# Patient Record
Sex: Male | Born: 1975 | Race: Black or African American | Hispanic: No | Marital: Married | Smoking: Never smoker
Health system: Southern US, Community
[De-identification: ages and names within clinical notes are randomized; demographics above are authoritative.]

## PROBLEM LIST (undated history)

## (undated) DIAGNOSIS — I2699 Other pulmonary embolism without acute cor pulmonale: Secondary | ICD-10-CM

## (undated) DIAGNOSIS — L309 Dermatitis, unspecified: Secondary | ICD-10-CM

## (undated) HISTORY — PX: LAPAROSCOPIC GASTRIC BAND REMOVAL WITH LAPAROSCOPIC GASTRIC SLEEVE RESECTION: SHX6498

## (undated) HISTORY — PX: WRIST FRACTURE SURGERY: SHX121

## (undated) HISTORY — PX: KNEE SURGERY: SHX244

---

## 1997-12-12 ENCOUNTER — Emergency Department (HOSPITAL_COMMUNITY): Admission: EM | Admit: 1997-12-12 | Discharge: 1997-12-13 | Payer: Self-pay | Admitting: Emergency Medicine

## 1998-01-19 ENCOUNTER — Emergency Department (HOSPITAL_COMMUNITY): Admission: EM | Admit: 1998-01-19 | Discharge: 1998-01-19 | Payer: Self-pay | Admitting: Emergency Medicine

## 1998-07-05 ENCOUNTER — Emergency Department (HOSPITAL_COMMUNITY): Admission: EM | Admit: 1998-07-05 | Discharge: 1998-07-06 | Payer: Self-pay | Admitting: Emergency Medicine

## 1998-09-29 ENCOUNTER — Emergency Department (HOSPITAL_COMMUNITY): Admission: EM | Admit: 1998-09-29 | Discharge: 1998-09-29 | Payer: Self-pay | Admitting: Emergency Medicine

## 1999-06-21 ENCOUNTER — Emergency Department (HOSPITAL_COMMUNITY): Admission: EM | Admit: 1999-06-21 | Discharge: 1999-06-21 | Payer: Self-pay | Admitting: Emergency Medicine

## 2000-04-10 ENCOUNTER — Emergency Department (HOSPITAL_COMMUNITY): Admission: EM | Admit: 2000-04-10 | Discharge: 2000-04-10 | Payer: Self-pay | Admitting: Emergency Medicine

## 2000-09-23 ENCOUNTER — Emergency Department (HOSPITAL_COMMUNITY): Admission: EM | Admit: 2000-09-23 | Discharge: 2000-09-23 | Payer: Self-pay | Admitting: Emergency Medicine

## 2001-06-18 ENCOUNTER — Emergency Department (HOSPITAL_COMMUNITY): Admission: EM | Admit: 2001-06-18 | Discharge: 2001-06-18 | Payer: Self-pay | Admitting: Emergency Medicine

## 2001-09-27 ENCOUNTER — Emergency Department (HOSPITAL_COMMUNITY): Admission: EM | Admit: 2001-09-27 | Discharge: 2001-09-27 | Payer: Self-pay

## 2002-02-28 ENCOUNTER — Encounter: Payer: Self-pay | Admitting: Emergency Medicine

## 2002-02-28 ENCOUNTER — Emergency Department (HOSPITAL_COMMUNITY): Admission: EM | Admit: 2002-02-28 | Discharge: 2002-02-28 | Payer: Self-pay | Admitting: Emergency Medicine

## 2002-11-07 ENCOUNTER — Emergency Department (HOSPITAL_COMMUNITY): Admission: EM | Admit: 2002-11-07 | Discharge: 2002-11-07 | Payer: Self-pay | Admitting: Emergency Medicine

## 2002-12-02 ENCOUNTER — Encounter: Payer: Self-pay | Admitting: Emergency Medicine

## 2002-12-02 ENCOUNTER — Emergency Department (HOSPITAL_COMMUNITY): Admission: EM | Admit: 2002-12-02 | Discharge: 2002-12-02 | Payer: Self-pay | Admitting: Emergency Medicine

## 2002-12-25 ENCOUNTER — Emergency Department (HOSPITAL_COMMUNITY): Admission: EM | Admit: 2002-12-25 | Discharge: 2002-12-25 | Payer: Self-pay | Admitting: *Deleted

## 2003-06-09 ENCOUNTER — Emergency Department (HOSPITAL_COMMUNITY): Admission: EM | Admit: 2003-06-09 | Discharge: 2003-06-09 | Payer: Self-pay | Admitting: Emergency Medicine

## 2003-10-16 ENCOUNTER — Emergency Department (HOSPITAL_COMMUNITY): Admission: EM | Admit: 2003-10-16 | Discharge: 2003-10-16 | Payer: Self-pay | Admitting: Emergency Medicine

## 2005-10-25 ENCOUNTER — Emergency Department (HOSPITAL_COMMUNITY): Admission: EM | Admit: 2005-10-25 | Discharge: 2005-10-26 | Payer: Self-pay | Admitting: *Deleted

## 2005-12-29 ENCOUNTER — Emergency Department (HOSPITAL_COMMUNITY): Admission: EM | Admit: 2005-12-29 | Discharge: 2005-12-29 | Payer: Self-pay | Admitting: Emergency Medicine

## 2008-04-06 ENCOUNTER — Emergency Department (HOSPITAL_COMMUNITY): Admission: EM | Admit: 2008-04-06 | Discharge: 2008-04-06 | Payer: Self-pay | Admitting: Emergency Medicine

## 2008-06-24 ENCOUNTER — Emergency Department (HOSPITAL_COMMUNITY): Admission: EM | Admit: 2008-06-24 | Discharge: 2008-06-24 | Payer: Self-pay | Admitting: Emergency Medicine

## 2008-06-25 ENCOUNTER — Encounter (INDEPENDENT_AMBULATORY_CARE_PROVIDER_SITE_OTHER): Payer: Self-pay | Admitting: Emergency Medicine

## 2008-06-25 ENCOUNTER — Inpatient Hospital Stay (HOSPITAL_COMMUNITY): Admission: EM | Admit: 2008-06-25 | Discharge: 2008-06-28 | Payer: Self-pay | Admitting: Emergency Medicine

## 2008-06-25 ENCOUNTER — Ambulatory Visit: Payer: Self-pay | Admitting: Vascular Surgery

## 2008-06-27 ENCOUNTER — Other Ambulatory Visit: Payer: Self-pay | Admitting: Cardiovascular Disease

## 2008-08-19 ENCOUNTER — Emergency Department (HOSPITAL_COMMUNITY): Admission: EM | Admit: 2008-08-19 | Discharge: 2008-08-19 | Payer: Self-pay | Admitting: Emergency Medicine

## 2008-08-31 ENCOUNTER — Emergency Department (HOSPITAL_COMMUNITY): Admission: EM | Admit: 2008-08-31 | Discharge: 2008-09-01 | Payer: Self-pay | Admitting: Emergency Medicine

## 2009-04-07 ENCOUNTER — Emergency Department (HOSPITAL_COMMUNITY): Admission: EM | Admit: 2009-04-07 | Discharge: 2009-04-08 | Payer: Self-pay | Admitting: Emergency Medicine

## 2010-04-02 ENCOUNTER — Emergency Department (HOSPITAL_COMMUNITY)
Admission: EM | Admit: 2010-04-02 | Discharge: 2010-04-02 | Payer: Self-pay | Source: Home / Self Care | Admitting: Emergency Medicine

## 2010-08-10 LAB — RAPID STREP SCREEN (MED CTR MEBANE ONLY): Streptococcus, Group A Screen (Direct): NEGATIVE

## 2010-08-17 ENCOUNTER — Emergency Department (HOSPITAL_COMMUNITY)
Admission: EM | Admit: 2010-08-17 | Discharge: 2010-08-17 | Disposition: A | Discharge status: Home / Self Care | Payer: Self-pay | Attending: Emergency Medicine | Admitting: Emergency Medicine

## 2010-08-17 DIAGNOSIS — M25539 Pain in unspecified wrist: Secondary | ICD-10-CM | POA: Insufficient documentation

## 2010-09-01 LAB — CBC
HCT: 40 % (ref 39.0–52.0)
Hemoglobin: 13.6 g/dL (ref 13.0–17.0)
MCHC: 34 g/dL (ref 30.0–36.0)
MCV: 88.7 fL (ref 78.0–100.0)
Platelets: 229 10*3/uL (ref 150–400)
RBC: 4.52 MIL/uL (ref 4.22–5.81)
RDW: 14.1 % (ref 11.5–15.5)
WBC: 9.9 10*3/uL (ref 4.0–10.5)

## 2010-09-01 LAB — POCT CARDIAC MARKERS
CKMB, poc: <1 ng/mL — ABNORMAL LOW (ref 1.0–8.0)
Myoglobin, poc: 82.5 ng/mL (ref 12–200)
Troponin i, poc: <0.05 ng/mL (ref 0.00–0.09)

## 2010-09-01 LAB — COMPREHENSIVE METABOLIC PANEL
ALT: 24 U/L (ref 0–53)
AST: 19 U/L (ref 0–37)
Albumin: 3.2 g/dL — ABNORMAL LOW (ref 3.5–5.2)
Alkaline Phosphatase: 68 U/L (ref 39–117)
BUN: 9 mg/dL (ref 6–23)
CO2: 25 mEq/L (ref 19–32)
Calcium: 8.8 mg/dL (ref 8.4–10.5)
Chloride: 107 mEq/L (ref 96–112)
Creatinine, Ser: 0.88 mg/dL (ref 0.4–1.5)
GFR calc Af Amer: >60 mL/min (ref >60)
GFR calc non Af Amer: >60 mL/min (ref >60)
Glucose, Bld: 123 mg/dL — ABNORMAL HIGH (ref 70–99)
Potassium: 3.7 mEq/L (ref 3.5–5.1)
Sodium: 138 mEq/L (ref 135–145)
Total Bilirubin: 0.8 mg/dL (ref 0.3–1.2)
Total Protein: 7 g/dL (ref 6.0–8.3)

## 2010-09-01 LAB — LIPASE, BLOOD: Lipase: 21 U/L (ref 11–59)

## 2010-09-01 LAB — DIFFERENTIAL
Basophils Absolute: 0 10*3/uL (ref 0.0–0.1)
Basophils Relative: 0 % (ref 0–1)
Eosinophils Absolute: 0.3 10*3/uL (ref 0.0–0.7)
Eosinophils Relative: 3 % (ref 0–5)
Lymphocytes Relative: 31 % (ref 12–46)
Lymphs Abs: 3 10*3/uL (ref 0.7–4.0)
Monocytes Absolute: 0.7 10*3/uL (ref 0.1–1.0)
Monocytes Relative: 7 % (ref 3–12)
Neutro Abs: 5.8 10*3/uL (ref 1.7–7.7)
Neutrophils Relative %: 59 % (ref 43–77)

## 2010-09-08 LAB — URINALYSIS, ROUTINE W REFLEX MICROSCOPIC
Bilirubin Urine: NEGATIVE
Glucose, UA: NEGATIVE mg/dL
Hgb urine dipstick: NEGATIVE
Ketones, ur: NEGATIVE mg/dL
Nitrite: NEGATIVE
Protein, ur: NEGATIVE mg/dL
Specific Gravity, Urine: 1.028 (ref 1.005–1.030)
Urobilinogen, UA: 1 mg/dL (ref 0.0–1.0)
pH: 6 (ref 5.0–8.0)

## 2010-09-08 LAB — CK TOTAL AND CKMB (NOT AT ARMC)
CK, MB: 1.5 ng/mL (ref 0.3–4.0)
Relative Index: 0.6 (ref 0.0–2.5)
Total CK: 241 U/L — ABNORMAL HIGH (ref 7–232)

## 2010-09-08 LAB — COMPREHENSIVE METABOLIC PANEL
ALT: 31 U/L (ref 0–53)
AST: 22 U/L (ref 0–37)
Albumin: 3.2 g/dL — ABNORMAL LOW (ref 3.5–5.2)
Alkaline Phosphatase: 79 U/L (ref 39–117)
BUN: 11 mg/dL (ref 6–23)
CO2: 24 mEq/L (ref 19–32)
Calcium: 8.8 mg/dL (ref 8.4–10.5)
Chloride: 107 mEq/L (ref 96–112)
Creatinine, Ser: 0.87 mg/dL (ref 0.4–1.5)
GFR calc Af Amer: >60 mL/min (ref >60)
GFR calc non Af Amer: >60 mL/min (ref >60)
Glucose, Bld: 122 mg/dL — ABNORMAL HIGH (ref 70–99)
Potassium: 3.8 mEq/L (ref 3.5–5.1)
Sodium: 137 mEq/L (ref 135–145)
Total Bilirubin: 0.7 mg/dL (ref 0.3–1.2)
Total Protein: 6.7 g/dL (ref 6.0–8.3)

## 2010-09-08 LAB — URINE MICROSCOPIC-ADD ON

## 2010-09-08 LAB — DIFFERENTIAL
Basophils Absolute: 0 10*3/uL (ref 0.0–0.1)
Basophils Relative: 0 % (ref 0–1)
Eosinophils Absolute: 0.2 10*3/uL (ref 0.0–0.7)
Eosinophils Relative: 2 % (ref 0–5)
Lymphocytes Relative: 27 % (ref 12–46)
Lymphs Abs: 2.8 10*3/uL (ref 0.7–4.0)
Monocytes Absolute: 0.6 10*3/uL (ref 0.1–1.0)
Monocytes Relative: 6 % (ref 3–12)
Neutro Abs: 6.6 10*3/uL (ref 1.7–7.7)
Neutrophils Relative %: 64 % (ref 43–77)

## 2010-09-08 LAB — POCT CARDIAC MARKERS
CKMB, poc: <1 ng/mL — ABNORMAL LOW (ref 1.0–8.0)
CKMB, poc: <1 ng/mL — ABNORMAL LOW (ref 1.0–8.0)
Myoglobin, poc: 60.1 ng/mL (ref 12–200)
Myoglobin, poc: 67.8 ng/mL (ref 12–200)
Troponin i, poc: <0.05 ng/mL (ref 0.00–0.09)
Troponin i, poc: <0.05 ng/mL (ref 0.00–0.09)

## 2010-09-08 LAB — CBC
HCT: 40.7 % (ref 39.0–52.0)
Hemoglobin: 13.6 g/dL (ref 13.0–17.0)
MCHC: 33.3 g/dL (ref 30.0–36.0)
MCV: 88.3 fL (ref 78.0–100.0)
Platelets: 259 10*3/uL (ref 150–400)
RBC: 4.61 MIL/uL (ref 4.22–5.81)
RDW: 14.2 % (ref 11.5–15.5)
WBC: 10.2 10*3/uL (ref 4.0–10.5)

## 2010-09-08 LAB — RAPID URINE DRUG SCREEN, HOSP PERFORMED
Amphetamines: NOT DETECTED
Barbiturates: NOT DETECTED
Benzodiazepines: NOT DETECTED
Cocaine: NOT DETECTED
Opiates: NOT DETECTED
Tetrahydrocannabinol: NOT DETECTED

## 2010-09-08 LAB — D-DIMER, QUANTITATIVE: D-Dimer, Quant: 0.29 ug/mL-FEU (ref 0.00–0.48)

## 2010-09-08 LAB — LIPASE, BLOOD: Lipase: 21 U/L (ref 11–59)

## 2010-09-08 LAB — POCT I-STAT, CHEM 8
BUN: 12 mg/dL (ref 6–23)
Calcium, Ion: 1.15 mmol/L (ref 1.12–1.32)
Chloride: 106 mEq/L (ref 96–112)
Creatinine, Ser: 0.9 mg/dL (ref 0.4–1.5)
Glucose, Bld: 118 mg/dL — ABNORMAL HIGH (ref 70–99)
HCT: 43 % (ref 39.0–52.0)
Hemoglobin: 14.6 g/dL (ref 13.0–17.0)
Potassium: 3.9 mEq/L (ref 3.5–5.1)
Sodium: 140 mEq/L (ref 135–145)
TCO2: 24 mmol/L (ref 0–100)

## 2010-09-08 LAB — PROTIME-INR
INR: 1 (ref 0.00–1.49)
Prothrombin Time: 13.1 seconds (ref 11.6–15.2)

## 2010-09-08 LAB — ETHANOL: Alcohol, Ethyl (B): <5 mg/dL (ref 0–10)

## 2010-09-08 LAB — APTT: aPTT: 27 seconds (ref 24–37)

## 2010-09-08 LAB — TROPONIN I: Troponin I: 0.02 ng/mL (ref 0.00–0.06)

## 2010-09-09 LAB — URINALYSIS, ROUTINE W REFLEX MICROSCOPIC
Bilirubin Urine: NEGATIVE
Glucose, UA: NEGATIVE mg/dL
Hgb urine dipstick: NEGATIVE
Ketones, ur: NEGATIVE mg/dL
Nitrite: NEGATIVE
Protein, ur: NEGATIVE mg/dL
Specific Gravity, Urine: 1.025 (ref 1.005–1.030)
Urobilinogen, UA: 1 mg/dL (ref 0.0–1.0)
pH: 7.5 (ref 5.0–8.0)

## 2010-09-09 LAB — URINE CULTURE
Colony Count: NO GROWTH
Culture: NO GROWTH

## 2010-09-09 LAB — GC/CHLAMYDIA PROBE AMP, GENITAL
Chlamydia, DNA Probe: POSITIVE — AB
GC Probe Amp, Genital: NEGATIVE

## 2010-09-09 LAB — URINE MICROSCOPIC-ADD ON

## 2010-09-13 LAB — GLUCOSE, CAPILLARY
Glucose-Capillary: 101 mg/dL — ABNORMAL HIGH (ref 70–99)
Glucose-Capillary: 103 mg/dL — ABNORMAL HIGH (ref 70–99)
Glucose-Capillary: 104 mg/dL — ABNORMAL HIGH (ref 70–99)
Glucose-Capillary: 104 mg/dL — ABNORMAL HIGH (ref 70–99)
Glucose-Capillary: 106 mg/dL — ABNORMAL HIGH (ref 70–99)
Glucose-Capillary: 107 mg/dL — ABNORMAL HIGH (ref 70–99)
Glucose-Capillary: 94 mg/dL (ref 70–99)
Glucose-Capillary: 96 mg/dL (ref 70–99)
Glucose-Capillary: 99 mg/dL (ref 70–99)

## 2010-09-13 LAB — LIPID PANEL
Cholesterol: 128 mg/dL (ref 0–200)
HDL: 22 mg/dL — ABNORMAL LOW (ref >39)
LDL Cholesterol: 79 mg/dL (ref 0–99)
Total CHOL/HDL Ratio: 5.8 RATIO
Triglycerides: 135 mg/dL (ref <150)
VLDL: 27 mg/dL (ref 0–40)

## 2010-09-13 LAB — BASIC METABOLIC PANEL
BUN: 10 mg/dL (ref 6–23)
BUN: 8 mg/dL (ref 6–23)
BUN: 8 mg/dL (ref 6–23)
CO2: 24 mEq/L (ref 19–32)
CO2: 25 mEq/L (ref 19–32)
CO2: 26 mEq/L (ref 19–32)
Calcium: 8.4 mg/dL (ref 8.4–10.5)
Calcium: 8.5 mg/dL (ref 8.4–10.5)
Calcium: 9.2 mg/dL (ref 8.4–10.5)
Chloride: 105 mEq/L (ref 96–112)
Chloride: 105 mEq/L (ref 96–112)
Chloride: 106 mEq/L (ref 96–112)
Creatinine, Ser: 0.84 mg/dL (ref 0.4–1.5)
Creatinine, Ser: 0.94 mg/dL (ref 0.4–1.5)
Creatinine, Ser: 0.99 mg/dL (ref 0.4–1.5)
GFR calc Af Amer: >60 mL/min (ref >60)
GFR calc Af Amer: >60 mL/min (ref >60)
GFR calc Af Amer: >60 mL/min (ref >60)
GFR calc non Af Amer: >60 mL/min (ref >60)
GFR calc non Af Amer: >60 mL/min (ref >60)
GFR calc non Af Amer: >60 mL/min (ref >60)
Glucose, Bld: 101 mg/dL — ABNORMAL HIGH (ref 70–99)
Glucose, Bld: 108 mg/dL — ABNORMAL HIGH (ref 70–99)
Glucose, Bld: 109 mg/dL — ABNORMAL HIGH (ref 70–99)
Potassium: 3.7 mEq/L (ref 3.5–5.1)
Potassium: 4.1 mEq/L (ref 3.5–5.1)
Potassium: 4.1 mEq/L (ref 3.5–5.1)
Sodium: 134 mEq/L — ABNORMAL LOW (ref 135–145)
Sodium: 137 mEq/L (ref 135–145)
Sodium: 138 mEq/L (ref 135–145)

## 2010-09-13 LAB — URINALYSIS, ROUTINE W REFLEX MICROSCOPIC
Bilirubin Urine: NEGATIVE
Glucose, UA: NEGATIVE mg/dL
Ketones, ur: NEGATIVE mg/dL
Nitrite: NEGATIVE
Protein, ur: NEGATIVE mg/dL
Specific Gravity, Urine: 1.025 (ref 1.005–1.030)
Urobilinogen, UA: 1 mg/dL (ref 0.0–1.0)
pH: 6.5 (ref 5.0–8.0)

## 2010-09-13 LAB — POCT CARDIAC MARKERS
CKMB, poc: <1 ng/mL — ABNORMAL LOW (ref 1.0–8.0)
CKMB, poc: <1 ng/mL — ABNORMAL LOW (ref 1.0–8.0)
CKMB, poc: <1 ng/mL — ABNORMAL LOW (ref 1.0–8.0)
Myoglobin, poc: 51.8 ng/mL (ref 12–200)
Myoglobin, poc: 70.2 ng/mL (ref 12–200)
Myoglobin, poc: 71.3 ng/mL (ref 12–200)
Troponin i, poc: <0.05 ng/mL (ref 0.00–0.09)
Troponin i, poc: <0.05 ng/mL (ref 0.00–0.09)
Troponin i, poc: <0.05 ng/mL (ref 0.00–0.09)

## 2010-09-13 LAB — CBC
HCT: 37.1 % — ABNORMAL LOW (ref 39.0–52.0)
HCT: 37.8 % — ABNORMAL LOW (ref 39.0–52.0)
HCT: 43.8 % (ref 39.0–52.0)
Hemoglobin: 12.3 g/dL — ABNORMAL LOW (ref 13.0–17.0)
Hemoglobin: 12.6 g/dL — ABNORMAL LOW (ref 13.0–17.0)
Hemoglobin: 14.6 g/dL (ref 13.0–17.0)
MCHC: 33.2 g/dL (ref 30.0–36.0)
MCHC: 33.3 g/dL (ref 30.0–36.0)
MCHC: 33.3 g/dL (ref 30.0–36.0)
MCV: 87.9 fL (ref 78.0–100.0)
MCV: 88.4 fL (ref 78.0–100.0)
MCV: 88.5 fL (ref 78.0–100.0)
Platelets: 243 10*3/uL (ref 150–400)
Platelets: 246 10*3/uL (ref 150–400)
Platelets: 274 10*3/uL (ref 150–400)
RBC: 4.22 MIL/uL (ref 4.22–5.81)
RBC: 4.27 MIL/uL (ref 4.22–5.81)
RBC: 4.96 MIL/uL (ref 4.22–5.81)
RDW: 13.5 % (ref 11.5–15.5)
RDW: 13.5 % (ref 11.5–15.5)
RDW: 13.6 % (ref 11.5–15.5)
WBC: 7.2 10*3/uL (ref 4.0–10.5)
WBC: 8 10*3/uL (ref 4.0–10.5)
WBC: 8.7 10*3/uL (ref 4.0–10.5)

## 2010-09-13 LAB — URINE MICROSCOPIC-ADD ON

## 2010-09-13 LAB — HEMOGLOBIN A1C
Hgb A1c MFr Bld: 6 % (ref 4.6–6.1)
Mean Plasma Glucose: 126 mg/dL

## 2010-09-13 LAB — POCT I-STAT 3, ART BLOOD GAS (G3+)
Acid-base deficit: 3 mmol/L — ABNORMAL HIGH (ref 0.0–2.0)
Bicarbonate: 21.6 mEq/L (ref 20.0–24.0)
O2 Saturation: 98 %
TCO2: 23 mmol/L (ref 0–100)
pCO2 arterial: 37.1 mmHg (ref 35.0–45.0)
pH, Arterial: 7.372 (ref 7.350–7.450)
pO2, Arterial: 100 mmHg (ref 80.0–100.0)

## 2010-09-13 LAB — CK TOTAL AND CKMB (NOT AT ARMC)
CK, MB: 0.9 ng/mL (ref 0.3–4.0)
Relative Index: 0.5 (ref 0.0–2.5)
Total CK: 165 U/L (ref 7–232)

## 2010-09-13 LAB — RAPID URINE DRUG SCREEN, HOSP PERFORMED
Amphetamines: NOT DETECTED
Barbiturates: NOT DETECTED
Benzodiazepines: NOT DETECTED
Cocaine: NOT DETECTED
Opiates: POSITIVE — AB
Tetrahydrocannabinol: NOT DETECTED

## 2010-09-13 LAB — DIFFERENTIAL
Basophils Absolute: 0.1 10*3/uL (ref 0.0–0.1)
Basophils Relative: 1 % (ref 0–1)
Eosinophils Absolute: 0.2 10*3/uL (ref 0.0–0.7)
Eosinophils Relative: 3 % (ref 0–5)
Lymphocytes Relative: 28 % (ref 12–46)
Lymphs Abs: 2.2 10*3/uL (ref 0.7–4.0)
Monocytes Absolute: 0.6 10*3/uL (ref 0.1–1.0)
Monocytes Relative: 7 % (ref 3–12)
Neutro Abs: 5 10*3/uL (ref 1.7–7.7)
Neutrophils Relative %: 62 % (ref 43–77)

## 2010-09-13 LAB — POCT I-STAT 3, VENOUS BLOOD GAS (G3P V)
Acid-base deficit: 5 mmol/L — ABNORMAL HIGH (ref 0.0–2.0)
Bicarbonate: 21.1 mEq/L (ref 20.0–24.0)
O2 Saturation: 70 %
TCO2: 22 mmol/L (ref 0–100)
pCO2, Ven: 41.9 mmHg — ABNORMAL LOW (ref 45.0–50.0)
pH, Ven: 7.309 — ABNORMAL HIGH (ref 7.250–7.300)
pO2, Ven: 40 mmHg (ref 30.0–45.0)

## 2010-09-13 LAB — TROPONIN I: Troponin I: <0.01 ng/mL (ref 0.00–0.06)

## 2010-09-13 LAB — URINE CULTURE
Colony Count: NO GROWTH
Culture: NO GROWTH
Special Requests: NEGATIVE

## 2010-09-13 LAB — CARDIAC PANEL(CRET KIN+CKTOT+MB+TROPI)
CK, MB: 0.8 ng/mL (ref 0.3–4.0)
CK, MB: 1 ng/mL (ref 0.3–4.0)
Relative Index: 0.5 (ref 0.0–2.5)
Relative Index: 0.7 (ref 0.0–2.5)
Total CK: 145 U/L (ref 7–232)
Total CK: 152 U/L (ref 7–232)
Troponin I: 0.01 ng/mL (ref 0.00–0.06)
Troponin I: 0.01 ng/mL (ref 0.00–0.06)

## 2010-09-13 LAB — PROTIME-INR
INR: 1 (ref 0.00–1.49)
Prothrombin Time: 13.5 seconds (ref 11.6–15.2)

## 2010-09-13 LAB — TSH: TSH: 1.341 u[IU]/mL (ref 0.350–4.500)

## 2010-09-13 LAB — D-DIMER, QUANTITATIVE: D-Dimer, Quant: 0.31 ug/mL-FEU (ref 0.00–0.48)

## 2010-10-12 NOTE — Cardiovascular Report (Signed)
[name], [name]               ACCOUNT NO.:  [id number]   MEDICAL RECORD NO.:  [id number]          PATIENT TYPE:  AMB   LOCATION:  CATH                         FACILITY:  [facility]   PHYSICIAN:  Antonieta Iba, MD   DATE OF BIRTH:  1976/01/15   DATE OF PROCEDURE:  06/27/2008  DATE OF DISCHARGE:  06/27/2008                            CARDIAC CATHETERIZATION   PHYSICIANS PERFORMING THE PROCEDURE:  1. Antonieta Iba, MD  2. Cristy Hilts. Jacinto Halim, MD   REASON FOR PROCEDURE:  Mr. [name] is a 35 year old morbidly obese  gentleman with chronic episodes of shortness of breath, recent episodes  of chest discomfort and a strong family history per the report of sudden  death on his father's side.  He was brought to the catheterization lab  for right and left heart catheterization to rule out coronary artery  disease and to evaluate right heart pressures.   PROCEDURE DETAILS:  The risks and benefits were detailed to the patient  and consent was obtained.  The patient was brought to the  catheterization lab and prepped and draped in usual sterile fashion.  The modified Seldinger technique was used to cannulate the right femoral  artery and vein.  The 7-French catheter was used to do the femoral vein  and 6-French was used in the femoral artery.  Right heart pressures were  obtained using a 7-French Swan catheter, and the Swan catheter was  removed after pressures were recorded.   Attempt to advance the wire of the right femoral artery showed some  resistance to the wire in the distal to mid right iliac artery.  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  Dr. Yates Decamp helped with further evaluation and placed a 6-French  sheath in the left femoral artery.  Various catheters were used to  advance up the left femoral artery and common iliac artery and to inject  dye down the right femoral iliac  and femoral artery.  Patent vessel was  noted though with a very small dissection flap.  The sheath on the right  femoral artery was removed and pressure held and hemostasis obtained.  A  6-French Judkins left and right catheter were used and advanced up the  left femoral artery sheath to engage the left main and right coronary  artery respectively.  Hand injection of contrast was used with  cinematography recorded evaluating the coronary anatomy.  At the end of  the case, the catheters were removed, and the sheath was removed, and  pressure held, and hemostasis obtained.  No complications were reported.   CORONARY DETAIL/ANATOMY:  Left main; left main is a moderate-sized  vessel that trifurcates into the LAD, [name], and left circumflex.  There  is no significant disease noted.   Left anterior descending; a moderate-to-large sized vessel with 2  diagonal branches noted.  There is no significant disease noted.   [name] vessel; the [name] is a moderate-sized vessel that extends distally  towards the apex with no significant  disease noted.   Left circumflex; left circumflex is a moderate-to-large sized vessel  that has several obtuse marginal branches.  There is no significant  disease noted.   Right coronary artery; the RCA is a moderate-sized dominant vessel with  a PD and PL branch noted distally.  No significant disease noted.   Right heart pressures;  Right atrium; mean of 15, right ventricular 45/8, mean of 19, PA  pressure 47/26, wedge pressure mean of 28.   FINAL IMPRESSION:  No significant coronary artery disease with a right  dominant coronary system.  Left ventriculogram was performed with  pigtail catheter after advancing across the aortic valve with low normal  systolic function.  Estimated ejection fraction of 50%.  No aortic  stenosis on pullback with no mitral regurgitation noted.  Right heart  pressures mildly elevated consistent with mild pulmonary hypertension.   Etiology of the chest pain is uncertain.  Shortness of breath maybe  secondary to his mild pulmonary hypertension.  I suspect that he may  have obstructive sleep apnea given his pulmonary hypertension and  underlying obese body habitus.  Consideration for outpatient sleep study  should be considered.  No additional cardiac workup will be done at this  time.  Given the small dissection in the right femoral artery, we will  have to follow up in the clinic at [facility] after a repeat right femoral arterial Doppler.  The patient could  follow up with Dr. Yates Decamp in [place].      Antonieta Iba, MD  Electronically Signed     TJG/MEDQ  D:  06/29/2008  T:  06/29/2008  Job:  [id number]

## 2010-10-12 NOTE — H&P (Signed)
NAME:  CORDARIOUS, ZEEK               ACCOUNT NO.:  [id number]   MEDICAL RECORD NO.:  [id number]          PATIENT TYPE:  INP   LOCATION:                               FACILITY:  [facility]   PHYSICIAN:  Peggye Pitt, M.D. DATE OF BIRTH:  1976-05-07   DATE OF ADMISSION:  06/25/2008  DATE OF DISCHARGE:                              HISTORY & PHYSICAL   PRIMARY CARE PHYSICIAN:  He does not have one.   CHIEF COMPLAINT:  Chest pain and shortness of breath.   HISTORY OF PRESENT ILLNESS:  Mr. Kollmann is a 35 year old morbidly obese  African American man who for the past week has been having intermittent  left-sided chest pain that is not exertional, radiates upon occasion to  his left shoulder.  He does describe some shortness of breath,  palpitations and lightheadedness with the chest pain.  He denies any  nausea, vomiting, abdominal pain or any other symptoms.  He also denies  any syncopal episodes.  He states that on one occasion the chest pain  was over the center of his chest.  He does state that his chest pain  exacerbates while lying flat and especially after a heavy meal.  His  coronary artery disease risk factors include his morbid obesity and  family history as his father had an acute myocardial infarction at the  age of 28.  Unfortunately, he does not have a primary care physician, so  we do not know what his cholesterol, diabetic or blood pressure status  is.  Because of his chest pain, we are called to admit him to the  hospital for further evaluation and management.   ALLERGIES:  He has no known allergies.   PAST MEDICAL HISTORY:  None.   HOME MEDICATIONS:  None.   SOCIAL HISTORY:  Denies any tobacco or illicit drug use.  He drinks  occasionally once in a blue moon.Marland Kitchen   FAMILY HISTORY:  Is positive for multiple heart attacks in all of the  males on his father's side of the family, he states, particularly that  his father had an MI at the age of 53.   REVIEW OF SYSTEMS:  Is  negative except as already mentioned on HPI.   PHYSICAL EXAMINATION:  VITAL SIGNS:  Upon admission, blood pressure  143/90, heart rate 62, respirations 20, O2 saturations 100% on room air  with a temperature of 97.6.  GENERAL:  He is alert, awake, oriented x3, does not appear to be in any  distress.  He is morbidly obese.  HEENT:  Normocephalic, atraumatic.  His pupils are equally reactive to  light and accommodation with intact extraocular movements.  His neck is supple.  No JVD, no lymphadenopathy, no bruits or goiter.  His lungs are clear to auscultation bilaterally.  His heart is regular rate and rhythm with no murmurs, rubs or gallops.  His abdomen is obese, soft, nontender, nondistended with positive bowel  sounds.  EXTREMITIES:  No edema, positive pulses.  His neurologic exam is grossly intact and nonfocal.   LABORATORY DATA:  Upon admission, sodium 138, potassium 4.1, chloride  105,  bicarb 26, BUN 8, creatinine 0.84 with a glucose of 103.  WBCs 8.0,  hemoglobin 14.6 and platelets of 274.  His D-dimer is negative at 0.31.  He has had 2 sets of point of care markers which were negative.  His  urinalysis has many bacteria with 21-50 WBCs and 21-50 RBCs.  Chest x-  ray shows low-lung volumes with crowding and atelectasis but no  infiltrate, edema or effusion is noted.   ASSESSMENT AND PLAN:  1. Chest pain.  Given his family history, we most certainly need to      rule out an acute coronary syndrome.  Will do this by placing him      on telemetry and by cycling his EKGs and cardiac enzymes.  We will      also further risk stratify him with hemoglobin A1c and fasting      lipid panel and will need to follow his blood pressure.  He may      need a stress test prior to his discharge, but this can be further      elucidated in the morning depending on the results of his cardiac      workup.  He has a low probability for pulmonary embolism.  However,      given his symptoms, this also  need to be considered.  His D-dimer      is negative.  He can most certainly not fit in a scanner given his      weight.  However, we will check lower extremity Dopplers.  If these      are negative, this plus addition of a negative D-dimer should be      sufficient to rule out a PE.  I also believe that GERD could be an      etiology for his chest pain given the fact that the chest pain is      exacerbated by heavy meals and lying flat, so we will also start      him on a PPI.  2. For his urinary tract infection, we will check a urine culture and      will place on Cipro for the time being.  3. For prophylaxis while in the hospital, place on Protonix for GI      prophylaxis and Lovenox for DVT prophylaxis.      Peggye Pitt, M.D.  Electronically Signed     EH/MEDQ  D:  06/25/2008  T:  06/25/2008  Job:  [id number]

## 2010-10-12 NOTE — Discharge Summary (Signed)
NAMESTEFAN, [name]               ACCOUNT NO.:  [id number]   MEDICAL RECORD NO.:  [id number]          PATIENT TYPE:  INP   LOCATION:  1410                         [facility]ACILITY:  [facility]   PHYSICIAN:  Hillery Aldo, M.D.   DATE OF BIRTH:  March 02, 1976   DATE OF ADMISSION:  06/25/2008  DATE OF DISCHARGE:  06/28/2008                               DISCHARGE SUMMARY   PRIMARY CARE PHYSICIAN:  None.   DISCHARGE DIAGNOSES:  1. Atypical chest pain.  2. Obesity.  3. Impaired fasting glucose.  4. Dyspnea likely secondary to obstructive sleep apnea/obesity      hypoventilation syndrome, outpatient sleep study recommended.  5. Mild hypertension.   DISCHARGE MEDICATIONS:  1. Metoprolol 50 mg p.o. b.i.d.  2. Vicodin 5/500 one tablet q.4 h. p.r.n. pain.   CONSULTATIONS:  Dr. Jacinto Halim of cardiology   BRIEF ADMISSION HISTORY OF PRESENT ILLNESS:  The patient is a 35-year-  old obese male who presented to the hospital with intermittent left-  sided chest pain radiating up to the left shoulder.  The pain was  nonexertional but was associated with some dyspnea, palpitations, and  lightheadedness.  Because he has a significant family history of his  father having had his first MI at the age of 34, the patient was  admitted for further evaluation and workup.  For the full details,  please see the dictated report done by Dr. Ardyth Harps.   PROCEDURES AND DIAGNOSTIC STUDIES:  1. Chest x-ray on June 24, 2008, showed no active disease.  2. Chest x-ray on June 25, 2008, showed borderline heart size,      stable.  Low lung volumes with vascular crowding and atelectasis.      Mild chronic bronchitic-type changes.  No infiltrates, edema, or      effusions.  3. Lower extremity Dopplers on June 25, 2008, showed no evidence of      DVT or superficial thrombosis.  4. Cardiac catheterization on June 27, 2008, showed no significant      coronary artery disease.  Normal LV systolic function.  Mild  pulmonary hypertension.   DISCHARGE LABORATORY VALUES:  Cardiac markers were negative x3 sets.   Sodium was 134, potassium 3.7, chloride 105, bicarb 24, glucose 109, BUN  8, creatinine 0.99.  White blood cell count was 8.7, hemoglobin 12.3,  hematocrit 37.1, platelets 246.  Lipid profile showed a cholesterol of  128, triglycerides 135, HDL 22, LDL 79.  Urine drug screen was positive  for opiates which she had received in the emergency department for pain.  TSH was 1.341.  Hemoglobin A1c was 6%.  D-dimer was 0.31.   HOSPITAL COURSE BY PROBLEM:  1. Atypical chest pain:  The patient endorsed very atypical symptoms.      His pain was nonexertional and increased with deep inspiration.  It      was musculoskeletal in terms of qualities.  Nevertheless, because      of the patient's significant history of obesity and a family      history of his father having had an MI at the age of 66, the  patient was evaluated by the cardiologist.  Three sets of enzymes      were negative.  The patient was not appreciably hyperlipidemic.      There were no EKG changes noted.  There was no arrhythmic events on      telemetry.  The patient was put on a proton pump inhibitor,      aspirin, and a beta-blocker along with medications for pain.  After      evaluation by the cardiologist, the patient opted to proceed with      cardiac catheterization and did not have any evidence of coronary      artery disease.  He was cleared for discharge by the cardiologist.      It is likely that his chest pain is either from gastroesophageal      reflux disease or possibly musculoskeletal causes.  We can treat      his pain symptomatically, and then we will discharge him on a      limited supply of Vicodin.  2. Obesity:  The patient was evaluated by the dietician and given      extensive instruction regarding weight loss.  Although bariatric      surgery might be of benefit to this patient, he is currently      uninsured  and likely cannot afford this type of surgery.      Nevertheless, he does need a primary care physician who can monitor      his efforts at weight loss, prescribe medications if indicated, and      refer him to a bariatric surgeon if his weight loss efforts prove      to be unsuccessful.  The patient has been given the number to the      physician referral hotline as well as the number to [facility].      He is encouraged to follow up with a primary care physician.  3. Impaired fasting glucose:  The patient had his CBG monitored before      meals and at bedtime.  His glucoses ranged from 94 to 106.  His      hemoglobin A1c was 6% which corresponds to a mean plasma glucose of      126.  The patient was again advised to lose weight to help prevent      him from progressing to frank diabetes.  4. Dyspnea:  The patient's dyspnea is likely due to obstructive sleep      apnea and obesity hypoventilation syndrome.  He also had mild      bronchitic changes on chest x-ray which could be contributing.  He      will need an outpatient sleep study and referral to a primary care      physician for ongoing evaluation.  He has been advised of this.  5. Hypertension:  The patient's blood pressure is controlled on      metoprolol b.i.d.  We will discharge him on this medication.  He is      encouraged to follow up in 2-4 weeks after discharge with a primary      care physician of his choice.  6. Pulmonary hypertension:  The patient did have mild pulmonary      hypertension documented on his cardiac catheterization.  This is      likely due to obstructive sleep apnea/obesity hypoventilation      syndrome.   DISPOSITION:  The patient is medically stable and will be discharged  home  today.  He has been given the number to Dr. Verl Dicker office and  instructed to call if he experiences any increased swelling, pain, or  drainage at the catheterization site or if he develops any pain or  numbness in the right  leg.  He is advised to not do any heavy lifting  for 2 days.   Time spent coordinating care for discharge and discharge instructions  equals 35 minutes.      Hillery Aldo, M.D.  Electronically Signed     CR/MEDQ  D:  06/28/2008  T:  06/28/2008  Job:  [id number]

## 2012-08-12 ENCOUNTER — Emergency Department (HOSPITAL_COMMUNITY): Payer: Self-pay

## 2012-08-12 ENCOUNTER — Emergency Department (HOSPITAL_COMMUNITY)
Admission: EM | Admit: 2012-08-12 | Discharge: 2012-08-12 | Disposition: A | Discharge status: Home / Self Care | Payer: Self-pay | Attending: Emergency Medicine | Admitting: Emergency Medicine

## 2012-08-12 ENCOUNTER — Encounter (HOSPITAL_COMMUNITY): Payer: Self-pay | Admitting: Emergency Medicine

## 2012-08-12 DIAGNOSIS — R071 Chest pain on breathing: Secondary | ICD-10-CM | POA: Insufficient documentation

## 2012-08-12 DIAGNOSIS — R0602 Shortness of breath: Secondary | ICD-10-CM | POA: Insufficient documentation

## 2012-08-12 DIAGNOSIS — R0789 Other chest pain: Secondary | ICD-10-CM

## 2012-08-12 DIAGNOSIS — Z8669 Personal history of other diseases of the nervous system and sense organs: Secondary | ICD-10-CM | POA: Insufficient documentation

## 2012-08-12 DIAGNOSIS — M7989 Other specified soft tissue disorders: Secondary | ICD-10-CM | POA: Insufficient documentation

## 2012-08-12 LAB — BASIC METABOLIC PANEL
BUN: 10 mg/dL (ref 6–23)
CO2: 26 mEq/L (ref 19–32)
Calcium: 8.8 mg/dL (ref 8.4–10.5)
Chloride: 102 mEq/L (ref 96–112)
Creatinine, Ser: 0.92 mg/dL (ref 0.50–1.35)
GFR calc Af Amer: >90 mL/min (ref >90)
GFR calc non Af Amer: >90 mL/min (ref >90)
Glucose, Bld: 115 mg/dL — ABNORMAL HIGH (ref 70–99)
Potassium: 3.8 mEq/L (ref 3.5–5.1)
Sodium: 136 mEq/L (ref 135–145)

## 2012-08-12 LAB — CBC WITH DIFFERENTIAL/PLATELET
Basophils Absolute: 0 10*3/uL (ref 0.0–0.1)
Basophils Relative: 0 % (ref 0–1)
Eosinophils Absolute: 0.4 10*3/uL (ref 0.0–0.7)
Eosinophils Relative: 4 % (ref 0–5)
HCT: 40.4 % (ref 39.0–52.0)
Hemoglobin: 13.1 g/dL (ref 13.0–17.0)
Lymphocytes Relative: 27 % (ref 12–46)
Lymphs Abs: 2.8 10*3/uL (ref 0.7–4.0)
MCH: 28.9 pg (ref 26.0–34.0)
MCHC: 32.4 g/dL (ref 30.0–36.0)
MCV: 89 fL (ref 78.0–100.0)
Monocytes Absolute: 0.7 10*3/uL (ref 0.1–1.0)
Monocytes Relative: 7 % (ref 3–12)
Neutro Abs: 6.3 10*3/uL (ref 1.7–7.7)
Neutrophils Relative %: 62 % (ref 43–77)
Platelets: 270 10*3/uL (ref 150–400)
RBC: 4.54 MIL/uL (ref 4.22–5.81)
RDW: 13.9 % (ref 11.5–15.5)
WBC: 10.3 10*3/uL (ref 4.0–10.5)

## 2012-08-12 LAB — D-DIMER, QUANTITATIVE: D-Dimer, Quant: 0.33 ug/mL-FEU (ref 0.00–0.48)

## 2012-08-12 LAB — TROPONIN I
Troponin I: <0.3 ng/mL (ref <0.30)
Troponin I: <0.3 ng/mL (ref <0.30)

## 2012-08-12 MED ORDER — MORPHINE SULFATE 4 MG/ML IJ SOLN
4.0000 mg | Freq: Once | INTRAMUSCULAR | Status: AC
  Administered 2012-08-12: 4 mg via INTRAVENOUS
  Filled 2012-08-12: qty 1

## 2012-08-12 MED ORDER — IBUPROFEN 800 MG PO TABS: 800.0000 mg | ORAL_TABLET | Freq: Three times a day (TID) | ORAL | Status: DC | PRN

## 2012-08-12 MED ORDER — ONDANSETRON HCL 4 MG/2ML IJ SOLN
4.0000 mg | Freq: Once | INTRAMUSCULAR | Status: AC
  Administered 2012-08-12: 4 mg via INTRAVENOUS
  Filled 2012-08-12: qty 2

## 2012-08-12 NOTE — ED Provider Notes (Signed)
History     CSN: [id number]  Arrival date & time 08/12/12  4098   First MD Initiated Contact with Patient 08/12/12 [phone]      Chief Complaint  Patient presents with  . Chest Pain    (Consider location/radiation/quality/duration/timing/severity/associated sxs/prior treatment) HPI Comments: Patient reports central chest pain that began several weeks ago and became more intense yesterday.  Pain is sharp in nature, radiates to the left chest and down the left arm, 8/10 intensity, comes and goes (last night coming every 8-10 minutes, lasts 30 seconds at a time).  Associated SOB that began only last night.  Denies exacerbating or palliative symptoms.  Pain is not exertional or pleuritic.  Pain woke him from sleep this morning at 4am.  Did note his right lower leg is swollen today, denies any pain in the leg.  Pt has significant history of morbid obesity and obstructive sleep apnea.  Cardiac cath in 2010 showed no significant CAD.  Has not seen a care provider since that time.  Pt is sedentary but has had no recent immobilization, no personal or family history of blood clots.  Has significant family hx of CAD - his father had his first MI at 76.    The history is provided by the patient.    History reviewed. No pertinent past medical history.  History reviewed. No pertinent past surgical history.  No family history on file.  History  Substance Use Topics  . Smoking status: Never Smoker   . Smokeless tobacco: Not on file  . Alcohol Use: No      Review of Systems  Constitutional: Negative for fever and chills.  HENT: Negative for sore throat.   Respiratory: Positive for shortness of breath. Negative for cough.   Cardiovascular: Positive for chest pain.  Gastrointestinal: Negative for nausea, vomiting, abdominal pain and diarrhea.    Allergies  Review of patient's allergies indicates no known allergies.  Home Medications  No current outpatient prescriptions on file.  BP 149/88   Pulse 69  Temp(Src) 98 F (36.7 C)  Resp 16  Wt 350 lb (158.759 kg)  SpO2 100%  Physical Exam  Nursing note and vitals reviewed. Constitutional: He appears well-developed and well-nourished. No distress.  HENT:  Head: Normocephalic and atraumatic.  Neck: Neck supple.  Cardiovascular: Normal rate and regular rhythm.   Pulmonary/Chest: Effort normal and breath sounds normal. No respiratory distress. He has no wheezes. He has no rales. He exhibits tenderness.  Chest wall tenderness reproduces pain  Abdominal: Soft. He exhibits no distension and no mass. There is generalized tenderness. There is no rebound and no guarding.  Musculoskeletal:  Bilateral lower extremities are very large, measure equal in size.    Neurological: He is alert. He exhibits normal muscle tone.  Skin: He is not diaphoretic.  Psychiatric: He has a normal mood and affect. His behavior is normal.    ED Course  Procedures (including critical care time)  Labs Reviewed  BASIC METABOLIC PANEL - Abnormal; Notable for the following:    Glucose, Bld 115 (*)    All other components within normal limits  TROPONIN I  CBC WITH DIFFERENTIAL  D-DIMER, QUANTITATIVE  TROPONIN I   No results found.   Date: 08/12/2012  Rate: 70  Rhythm: normal sinus rhythm  QRS Axis: normal  Intervals: normal  ST/T Wave abnormalities: normal  Conduction Disutrbances: none  Narrative Interpretation:   Old EKG Reviewed: No significant changes noted  9:51 AM Discussed patient with Dr Lynelle Doctor.  Plan is for second troponin.  If negative, pt to be d/c home with cardiology follow up.  (On call as well as Dr Nadara Eaton, to whom he was referred in 2010).    12:06 PM Pt currently feeling well.  No pain, no complaints.  Second troponin is negative.  Discussed return precautions and plan for follow up with the patient.    1. Chest wall pain     MDM  Obese patient with hx of clean cath in 2010 presents with intermittent sharp CP with associated  SOB.  CXR, 2 troponins, d-dimer negative.  Pain is atypical.  Doubt ACS.  Doubt PE.  Pain is reproducible - likely musculoskeletal.  Pt needs close follow up given his hyperglycemia and morbid obesity.  Pt advised to follow closely with PCP and cardiology.  Discussed all results with patient.  Pt given return precautions.  Pt verbalizes understanding and agrees with plan.           Trixie Dredge, PA-C 08/12/12 1429

## 2012-08-12 NOTE — ED Notes (Signed)
Pt alert, arrives from home, c/o chest pain, onset was yesterday, describes pain as sharp, intermittent, radiates to left arm, denies n/v, no sob

## 2012-08-14 NOTE — ED Provider Notes (Signed)
Medical screening examination/treatment/procedure(s) were performed by non-physician practitioner and as supervising physician I was immediately available for consultation/collaboration.    Anthea Udovich R Doron Shake, MD 08/14/12 2249 

## 2012-11-01 ENCOUNTER — Emergency Department (HOSPITAL_BASED_OUTPATIENT_CLINIC_OR_DEPARTMENT_OTHER)
Admission: EM | Admit: 2012-11-01 | Discharge: 2012-11-01 | Disposition: A | Discharge status: Home / Self Care | Payer: BC Managed Care – PPO | Attending: Emergency Medicine | Admitting: Emergency Medicine

## 2012-11-01 ENCOUNTER — Encounter (HOSPITAL_BASED_OUTPATIENT_CLINIC_OR_DEPARTMENT_OTHER): Payer: Self-pay | Admitting: *Deleted

## 2012-11-01 ENCOUNTER — Emergency Department (HOSPITAL_BASED_OUTPATIENT_CLINIC_OR_DEPARTMENT_OTHER): Payer: BC Managed Care – PPO

## 2012-11-01 DIAGNOSIS — E669 Obesity, unspecified: Secondary | ICD-10-CM | POA: Insufficient documentation

## 2012-11-01 DIAGNOSIS — L259 Unspecified contact dermatitis, unspecified cause: Secondary | ICD-10-CM | POA: Insufficient documentation

## 2012-11-01 DIAGNOSIS — S93409A Sprain of unspecified ligament of unspecified ankle, initial encounter: Secondary | ICD-10-CM | POA: Insufficient documentation

## 2012-11-01 DIAGNOSIS — Y9389 Activity, other specified: Secondary | ICD-10-CM | POA: Insufficient documentation

## 2012-11-01 DIAGNOSIS — X500XXA Overexertion from strenuous movement or load, initial encounter: Secondary | ICD-10-CM | POA: Insufficient documentation

## 2012-11-01 DIAGNOSIS — Y9289 Other specified places as the place of occurrence of the external cause: Secondary | ICD-10-CM | POA: Insufficient documentation

## 2012-11-01 HISTORY — DX: Dermatitis, unspecified: L30.9

## 2012-11-01 MED ORDER — HYDROCODONE-ACETAMINOPHEN 5-325 MG PO TABS: 1.0000 | ORAL_TABLET | Freq: Four times a day (QID) | ORAL | Status: DC | PRN

## 2012-11-01 MED ORDER — IBUPROFEN 600 MG PO TABS: 600.0000 mg | ORAL_TABLET | Freq: Four times a day (QID) | ORAL | Status: DC | PRN

## 2012-11-01 MED ORDER — IBUPROFEN 400 MG PO TABS
600.0000 mg | ORAL_TABLET | Freq: Once | ORAL | Status: AC
  Administered 2012-11-01: 600 mg via ORAL
  Filled 2012-11-01: qty 1

## 2012-11-01 NOTE — ED Provider Notes (Signed)
History     CSN: [id number]  Arrival date & time 11/01/12  1757   First MD Initiated Contact with Patient 11/01/12 1841      Chief Complaint  Patient presents with  . Ankle Pain    (Consider location/radiation/quality/duration/timing/severity/associated sxs/prior treatment) Patient is a 37 y.o. male presenting with ankle pain. The history is provided by the patient.  Ankle Pain Location:  Ankle Time since incident:  90 minutes Injury: yes   Ankle location:  L ankle Pain details:    Quality:  Shooting and throbbing   Radiates to:  L leg   Severity:  Moderate   Timing:  Constant Worsened by:  Bearing weight Ineffective treatments:  None tried Associated symptoms: stiffness, swelling and tingling   Risk factors: obesity     Past Medical History  Diagnosis Date  . Eczema     History reviewed. No pertinent past surgical history.  No family history on file.  History  Substance Use Topics  . Smoking status: Never Smoker   . Smokeless tobacco: Not on file  . Alcohol Use: No      Review of Systems  Constitutional: Positive for activity change.  Musculoskeletal: Positive for myalgias, joint swelling, arthralgias and stiffness.  Skin: Negative for rash.    Allergies  Review of patient's allergies indicates no known allergies.  Home Medications   Current Outpatient Rx  Name  Route  Sig  Dispense  Refill  . hydrocortisone cream 1 %   Topical   Apply 1 application topically daily as needed (for dry skin.).         Marland Kitchen ibuprofen (ADVIL,MOTRIN) 800 MG tablet   Oral   Take 1 tablet (800 mg total) by mouth every 8 (eight) hours as needed for pain.   15 tablet   0   . ranitidine (ZANTAC) 150 MG tablet   Oral   Take 150 mg by mouth daily as needed for heartburn.           BP 127/86  Pulse 82  Temp(Src) 98.9 F (37.2 C) (Oral)  Resp 20  Wt 350 lb (158.759 kg)  SpO2 98%  Physical Exam  Nursing note and vitals reviewed. Constitutional: He is oriented  to person, place, and time. He appears well-developed.  HENT:  Head: Normocephalic and atraumatic.  Eyes: Conjunctivae and EOM are normal. Pupils are equal, round, and reactive to light.  Neck: Normal range of motion. Neck supple.  Cardiovascular: Normal rate and regular rhythm.   Pulmonary/Chest: Effort normal and breath sounds normal.  Abdominal: Soft. Bowel sounds are normal. He exhibits no distension. There is no tenderness. There is no rebound and no guarding.  Musculoskeletal:  Left ankle - lateral malleoli tenderness, anterior ankle tenderness and swelling. No instability appreciated.   Neurological: He is alert and oriented to person, place, and time.  Skin: Skin is warm.    ED Course  Procedures (including critical care time)  Labs Reviewed - No data to display Dg Ankle Complete Left  11/01/2012   *RADIOLOGY REPORT*  Clinical Data: Twisting injury and ankle pain  LEFT ANKLE COMPLETE - 3+ VIEW  Comparison: CT 04/06/2008  Findings: The mortise is symmetric.  Medial malleolar soft tissue swelling is evident.  Ossicle incidentally noted again, adjacent to the midfoot.  No fracture dislocation.  No radiopaque foreign body.  IMPRESSION: Medial malleolar soft tissue swelling may indicate ligamentous injury.  No fracture identified.   Original Report Authenticated By: Christiana Pellant, M.D.  No diagnosis found.    MDM  Pt comes in with cc of ankle injury. Know ankle laxity. Xray shows no fracture. Suspect ligamentous injury. Will start with RICE, crutches.  Derwood Kaplan, MD 11/01/12 [phone]

## 2012-11-01 NOTE — ED Notes (Signed)
Left ankle injury. States he stepped off a curb 2 hours ago and felt a pop.

## 2012-11-08 ENCOUNTER — Ambulatory Visit: Payer: BC Managed Care – PPO | Admitting: Family Medicine

## 2013-03-17 ENCOUNTER — Emergency Department (HOSPITAL_BASED_OUTPATIENT_CLINIC_OR_DEPARTMENT_OTHER): Payer: BC Managed Care – PPO

## 2013-03-17 ENCOUNTER — Encounter (HOSPITAL_BASED_OUTPATIENT_CLINIC_OR_DEPARTMENT_OTHER): Payer: Self-pay | Admitting: Emergency Medicine

## 2013-03-17 ENCOUNTER — Emergency Department (HOSPITAL_BASED_OUTPATIENT_CLINIC_OR_DEPARTMENT_OTHER)
Admission: EM | Admit: 2013-03-17 | Discharge: 2013-03-17 | Disposition: A | Discharge status: Home / Self Care | Payer: BC Managed Care – PPO | Attending: Emergency Medicine | Admitting: Emergency Medicine

## 2013-03-17 DIAGNOSIS — E669 Obesity, unspecified: Secondary | ICD-10-CM | POA: Insufficient documentation

## 2013-03-17 DIAGNOSIS — Z872 Personal history of diseases of the skin and subcutaneous tissue: Secondary | ICD-10-CM | POA: Insufficient documentation

## 2013-03-17 DIAGNOSIS — M5430 Sciatica, unspecified side: Secondary | ICD-10-CM

## 2013-03-17 DIAGNOSIS — M1610 Unilateral primary osteoarthritis, unspecified hip: Secondary | ICD-10-CM | POA: Insufficient documentation

## 2013-03-17 MED ORDER — OXYCODONE-ACETAMINOPHEN 5-325 MG PO TABS: 2.0000 | ORAL_TABLET | Freq: Four times a day (QID) | ORAL | Status: DC | PRN

## 2013-03-17 MED ORDER — IBUPROFEN 800 MG PO TABS: 800.0000 mg | ORAL_TABLET | Freq: Three times a day (TID) | ORAL | Status: DC

## 2013-03-17 NOTE — ED Notes (Signed)
Patient c/o left hip pain for the last two weeks  & has hurt worse the last few days, took ibuprofen Friday, no injury

## 2013-03-17 NOTE — ED Provider Notes (Signed)
CSN: [id number]     Arrival date & time 03/17/13  0945 History   First MD Initiated Contact with Patient 03/17/13 [phone]     Chief Complaint  Patient presents with  . Hip Pain   (Consider location/radiation/quality/duration/timing/severity/associated sxs/prior Treatment) The history is provided by the patient.  Chester Sibert is a 37 y.o. male history of eczema here presenting with left hip pain. Left hip pain and vomiting for last 2 weeks. Radiate down to the bottom of his left foot. Denies any falls or injury. He has history of arthritis and multiple orthopedic surgeries in the past but denies any hip surgery. Patient also obese.    Past Medical History  Diagnosis Date  . Eczema    Past Surgical History  Procedure Laterality Date  . Knee surgery      right  . Wrist fracture surgery     No family history on file. History  Substance Use Topics  . Smoking status: Never Smoker   . Smokeless tobacco: Not on file  . Alcohol Use: No    Review of Systems  Musculoskeletal: Positive for back pain.       L hip pain   All other systems reviewed and are negative.    Allergies  Review of patient's allergies indicates no known allergies.  Home Medications   Current Outpatient Rx  Name  Route  Sig  Dispense  Refill  . HYDROcodone-acetaminophen (NORCO/VICODIN) 5-325 MG per tablet   Oral   Take 1 tablet by mouth every 6 (six) hours as needed for pain.   10 tablet   0   . hydrocortisone cream 1 %   Topical   Apply 1 application topically daily as needed (for dry skin.).         Marland Kitchen ibuprofen (ADVIL,MOTRIN) 600 MG tablet   Oral   Take 1 tablet (600 mg total) by mouth every 6 (six) hours as needed for pain.   30 tablet   0   . ibuprofen (ADVIL,MOTRIN) 800 MG tablet   Oral   Take 1 tablet (800 mg total) by mouth every 8 (eight) hours as needed for pain.   15 tablet   0   . ranitidine (ZANTAC) 150 MG tablet   Oral   Take 150 mg by mouth daily as needed for heartburn.           BP 122/65  Pulse 60  Temp(Src) 97.7 F (36.5 C)  Resp 18  SpO2 99% Physical Exam  Nursing note and vitals reviewed. Constitutional: He is oriented to person, place, and time. He appears well-developed and well-nourished.  HENT:  Head: Normocephalic.  Eyes: Pupils are equal, round, and reactive to light.  Neck: Normal range of motion. Neck supple.  Cardiovascular: Normal rate, regular rhythm and normal heart sounds.   Pulmonary/Chest: Effort normal and breath sounds normal. No respiratory distress. He has no wheezes. He has no rales.  Abdominal: Soft. Bowel sounds are normal. He exhibits no distension. There is no tenderness. There is no rebound.  Obese   Musculoskeletal: Normal range of motion.  L hip dec ROM from pain   Neurological: He is alert and oriented to person, place, and time.  Positive straight leg raise L leg, neg on R side. 2+ pulses. Nl strength and sensation in lower extremities.   Skin: Skin is warm and dry.  Psychiatric: He has a normal mood and affect. His behavior is normal. Judgment and thought content normal.    ED Course  Procedures (including critical care time) Labs Review Labs Reviewed - No data to display Imaging Review Dg Hip Complete Left  03/17/2013   CLINICAL DATA:  Two week history of left hip pain  EXAM: LEFT HIP - COMPLETE 2+ VIEW  COMPARISON:  None.  FINDINGS: There is no evidence of hip fracture or dislocation. Mild bilateral hip joint osteoarthritis with narrowing of the superolateral joint space and early osteophyte formation. Lower lumbar degenerative disc disease and right worse than left L5-S1 facet arthropathy.  IMPRESSION: No acute osseous abnormality.  Mild bilateral hip osteoarthritis.   Electronically Signed   By: Malachy Moan M.D.   On: 03/17/2013 10:25    EKG Interpretation   None       MDM  No diagnosis found. Davidmichael Zarazua is a 37 y.o. male here with L hip pain. Xray showed some arthritis. I think he likely  has pain from arthritis vs sciatica. Will prescribe motrin, percocet prn. Recommend ortho fu.    Richardean Canal, MD 03/17/13 1056

## 2013-06-24 ENCOUNTER — Emergency Department (HOSPITAL_BASED_OUTPATIENT_CLINIC_OR_DEPARTMENT_OTHER): Payer: BC Managed Care – PPO

## 2013-06-24 ENCOUNTER — Encounter (HOSPITAL_BASED_OUTPATIENT_CLINIC_OR_DEPARTMENT_OTHER): Payer: Self-pay | Admitting: Emergency Medicine

## 2013-06-24 ENCOUNTER — Emergency Department (HOSPITAL_BASED_OUTPATIENT_CLINIC_OR_DEPARTMENT_OTHER)
Admission: EM | Admit: 2013-06-24 | Discharge: 2013-06-24 | Disposition: A | Discharge status: Home / Self Care | Payer: BC Managed Care – PPO | Attending: Emergency Medicine | Admitting: Emergency Medicine

## 2013-06-24 DIAGNOSIS — Z791 Long term (current) use of non-steroidal anti-inflammatories (NSAID): Secondary | ICD-10-CM | POA: Insufficient documentation

## 2013-06-24 DIAGNOSIS — S93409A Sprain of unspecified ligament of unspecified ankle, initial encounter: Secondary | ICD-10-CM

## 2013-06-24 DIAGNOSIS — W010XXA Fall on same level from slipping, tripping and stumbling without subsequent striking against object, initial encounter: Secondary | ICD-10-CM | POA: Insufficient documentation

## 2013-06-24 DIAGNOSIS — Z872 Personal history of diseases of the skin and subcutaneous tissue: Secondary | ICD-10-CM | POA: Insufficient documentation

## 2013-06-24 DIAGNOSIS — IMO0002 Reserved for concepts with insufficient information to code with codable children: Secondary | ICD-10-CM | POA: Insufficient documentation

## 2013-06-24 DIAGNOSIS — Y9301 Activity, walking, marching and hiking: Secondary | ICD-10-CM | POA: Insufficient documentation

## 2013-06-24 DIAGNOSIS — S8390XA Sprain of unspecified site of unspecified knee, initial encounter: Secondary | ICD-10-CM

## 2013-06-24 DIAGNOSIS — Y929 Unspecified place or not applicable: Secondary | ICD-10-CM | POA: Insufficient documentation

## 2013-06-24 MED ORDER — NAPROXEN 375 MG PO TABS: 375.0000 mg | ORAL_TABLET | Freq: Two times a day (BID) | ORAL | Status: DC

## 2013-06-24 NOTE — ED Notes (Signed)
Pt c/9o left knee injury x 1 hr ago

## 2013-06-24 NOTE — Discharge Instructions (Signed)
Ankle Sprain °An ankle sprain is an injury to the strong, fibrous tissues (ligaments) that hold the bones of your ankle joint together.  °CAUSES °An ankle sprain is usually caused by a fall or by twisting your ankle. Ankle sprains most commonly occur when you step on the outer edge of your foot, and your ankle turns inward. People who participate in sports are more prone to these types of injuries.  °SYMPTOMS  °· Pain in your ankle. The pain may be present at rest or only when you are trying to stand or walk. °· Swelling. °· Bruising. Bruising may develop immediately or within 1 to 2 days after your injury. °· Difficulty standing or walking, particularly when turning corners or changing directions. °DIAGNOSIS  °Your caregiver will ask you details about your injury and perform a physical exam of your ankle to determine if you have an ankle sprain. During the physical exam, your caregiver will press on and apply pressure to specific areas of your foot and ankle. Your caregiver will try to move your ankle in certain ways. An X-ray exam may be done to be sure a bone was not broken or a ligament did not separate from one of the bones in your ankle (avulsion fracture).  °TREATMENT  °Certain types of braces can help stabilize your ankle. Your caregiver can make a recommendation for this. Your caregiver may recommend the use of medicine for pain. If your sprain is severe, your caregiver may refer you to a surgeon who helps to restore function to parts of your skeletal system (orthopedist) or a physical therapist. °HOME CARE INSTRUCTIONS  °· Apply ice to your injury for 1 2 days or as directed by your caregiver. Applying ice helps to reduce inflammation and pain. °· Put ice in a plastic bag. °· Place a towel between your skin and the bag. °· Leave the ice on for 15-20 minutes at a time, every 2 hours while you are awake. °· Only take over-the-counter or prescription medicines for pain, discomfort, or fever as directed by  your caregiver. °· Elevate your injured ankle above the level of your heart as much as possible for 2 3 days. °· If your caregiver recommends crutches, use them as instructed. Gradually put weight on the affected ankle. Continue to use crutches or a cane until you can walk without feeling pain in your ankle. °· If you have a plaster splint, wear the splint as directed by your caregiver. Do not rest it on anything harder than a pillow for the first 24 hours. Do not put weight on it. Do not get it wet. You may take it off to take a shower or bath. °· You may have been given an elastic bandage to wear around your ankle to provide support. If the elastic bandage is too tight (you have numbness or tingling in your foot or your foot becomes cold and blue), adjust the bandage to make it comfortable. °· If you have an air splint, you may blow more air into it or let air out to make it more comfortable. You may take your splint off at night and before taking a shower or bath. Wiggle your toes in the splint several times per day to decrease swelling. °SEEK MEDICAL CARE IF:  °· You have rapidly increasing bruising or swelling. °· Your toes feel extremely cold or you lose feeling in your foot. °· Your pain is not relieved with medicine. °SEEK IMMEDIATE MEDICAL CARE IF: °· Your toes are numb   or blue. °· You have severe pain that is increasing. °MAKE SURE YOU:  °· Understand these instructions. °· Will watch your condition. °· Will get help right away if you are not doing well or get worse. °Document Released: 05/16/2005 Document Revised: 02/08/2012 Document Reviewed: 05/28/2011 °[employer]® Patient Information ©2014 [employer]. °Knee Sprain °A knee sprain is a tear in one of the strong, fibrous tissues that connect the bones (ligaments) in your knee. The severity of the sprain depends on how much of the ligament is torn. The tear can be either partial or complete. °CAUSES  °Often, sprains are a result of a fall or injury. The  force of the impact causes the fibers of your ligament to stretch too much. This excess tension causes the fibers of your ligament to tear. °SIGNS AND SYMPTOMS  °You may have some loss of motion in your knee. Other symptoms include: °· Bruising. °· Pain in the knee area. °· Tenderness of the knee to the touch. °· Swelling. °DIAGNOSIS  °To diagnose a knee sprain, your health care provider will physically examine your knee. Your health care provider may also suggest an X-ray exam of your knee to make sure no bones are broken. °TREATMENT  °If your ligament is only partially torn, treatment usually involves keeping the knee in a fixed position (immobilization) or bracing your knee for activities that require movement for several weeks. To do this, your health care provider will apply a bandage, cast, or splint to keep your knee from moving and to support your knee during movement until it heals. For a partially torn ligament, the healing process usually takes 4 6 weeks. °If your ligament is completely torn, depending on which ligament it is, you may need surgery to reconnect the ligament to the bone or reconstruct it. After surgery, a cast or splint may be applied and will need to stay on your knee for 4 6 weeks while your ligament heals. °HOME CARE INSTRUCTIONS °· Keep your injured knee elevated to decrease swelling. °· To ease pain and swelling, apply ice to the injured area: °· Put ice in a plastic bag. °· Place a towel between your skin and the bag. °· Leave the ice on for 20 minutes, 2 3 times a day. °· Only take medicine for pain as directed by your health care provider. °· Do not leave your knee unprotected until pain and stiffness go away (usually 4 6 weeks). °· If you have a cast or splint, do not allow it to get wet. If you have been instructed not to remove it, cover it with a plastic bag when you shower or bathe. Do not swim. °· Your health care provider may suggest exercises for you to do during your  recovery to prevent or limit permanent weakness and stiffness. °SEEK IMMEDIATE MEDICAL CARE IF: °· Your cast or splint becomes damaged. °· Your pain becomes worse. °· You have significant pain, swelling, or numbness below the cast or splint. °MAKE SURE YOU: °· Understand these instructions. °· Will watch your condition. °· Will get help right away if you are not doing well or get worse. °Document Released: 05/16/2005 Document Revised: 03/06/2013 Document Reviewed: 12/26/2012 °ExitCare® Patient Information ©2014 [employer]. ° °

## 2013-06-24 NOTE — ED Provider Notes (Signed)
CSN: [id number]     Arrival date & time 06/24/13  1233 History   First MD Initiated Contact with Patient 06/24/13 1406     Chief Complaint  Patient presents with  . Knee Injury   (Consider location/radiation/quality/duration/timing/severity/associated sxs/prior Treatment) HPI Comments: Patient presents with left knee pain. He states earlier today he was walking down some steps and slipped and fell twisting his left knee. He also complains of pain in his left ankle. He denies any other injuries. He denies hitting his head. There is no loss of consciousness. He denies any neck or back pain.   Past Medical History  Diagnosis Date  . Eczema    Past Surgical History  Procedure Laterality Date  . Knee surgery      right  . Wrist fracture surgery     History reviewed. No pertinent family history. History  Substance Use Topics  . Smoking status: Never Smoker   . Smokeless tobacco: Not on file  . Alcohol Use: No    Review of Systems  Constitutional: Negative for fever.  Gastrointestinal: Negative for nausea and vomiting.  Musculoskeletal: Positive for arthralgias and joint swelling. Negative for back pain and neck pain.  Skin: Negative for wound.  Neurological: Negative for weakness, numbness and headaches.    Allergies  Review of patient's allergies indicates no known allergies.  Home Medications   Current Outpatient Rx  Name  Route  Sig  Dispense  Refill  . hydrocortisone cream 1 %   Topical   Apply 1 application topically daily as needed (for dry skin.).         Marland Kitchen ibuprofen (ADVIL,MOTRIN) 800 MG tablet   Oral   Take 1 tablet (800 mg total) by mouth 3 (three) times daily.   21 tablet   0   . naproxen (NAPROSYN) 375 MG tablet   Oral   Take 1 tablet (375 mg total) by mouth 2 (two) times daily.   20 tablet   0   . ranitidine (ZANTAC) 150 MG tablet   Oral   Take 150 mg by mouth daily as needed for heartburn.          BP 158/81  Pulse 76  Temp(Src) 98.4 F  (36.9 C) (Oral)  Resp 22  Ht 6\' 6"  (1.981 m)  Wt 320 lb (145.151 kg)  BMI 36.99 kg/m2  SpO2 100% Physical Exam  Constitutional: He is oriented to person, place, and time. He appears well-developed and well-nourished.  Morbidly obese  HENT:  Head: Normocephalic and atraumatic.  Neck: Normal range of motion. Neck supple.  Cardiovascular: Normal rate.   Pulmonary/Chest: Effort normal.  Musculoskeletal: He exhibits edema and tenderness.  Patient's exam is somewhat limited due to his size. There is no obvious effusion to the knee joint. He has generalized tenderness along the left knee. He is able to his straight leg raise. His ligaments appear grossly intact however his exam is limited. He also has some tenderness on palpation of the medial and lateral aspects of the left ankle. No swelling or performing is noted. He has normal sensation and pulses in the foot. There is no pain on range of motion the left hip.  Neurological: He is alert and oriented to person, place, and time.  Skin: Skin is warm and dry.  Psychiatric: He has a normal mood and affect.    ED Course  Procedures (including critical care time) Labs Review Labs Reviewed - No data to display Imaging Review Dg Ankle Complete Left  06/24/2013   CLINICAL DATA:  Left ankle pain.  EXAM: LEFT ANKLE COMPLETE - 3+ VIEW  COMPARISON:  11/01/2012  FINDINGS: No acute fracture or dislocation is identified. The ankle mortise is intact and shows normal alignment. There is stable mild irregularity of the distal fibula which may relate to prior injury.  IMPRESSION: No acute fracture identified.   Electronically Signed   By: Aletta Edouard M.D.   On: 06/24/2013 14:52   Dg Knee Complete 4 Views Left  06/24/2013   CLINICAL DATA:  Fall  EXAM: LEFT KNEE - COMPLETE 4+ VIEW  COMPARISON:  DG HIP COMPLETE*L* dated 03/17/2013  FINDINGS: There is no fracture or dislocation of the left knee. No joint effusion.  IMPRESSION: No acute osseous abnormality.    Electronically Signed   By: Suzy Bouchard M.D.   On: 06/24/2013 13:43    EKG Interpretation   None       MDM   1. Knee sprain   2. Ankle sprain    No fractures are identified. Patient had an Ace wrap placed to the knee was given some crutches to use. I encouraged him to followup with his orthopedist for recheck. Given his size, the knee immobilizers that we had here I do not feel it fit appropriately.    Malvin Johns, MD 06/24/13 [phone]

## 2014-08-13 DIAGNOSIS — G4710 Hypersomnia, unspecified: Secondary | ICD-10-CM | POA: Insufficient documentation

## 2014-08-13 DIAGNOSIS — R0683 Snoring: Secondary | ICD-10-CM | POA: Insufficient documentation

## 2014-08-13 DIAGNOSIS — J351 Hypertrophy of tonsils: Secondary | ICD-10-CM | POA: Insufficient documentation

## 2014-08-13 DIAGNOSIS — R5383 Other fatigue: Secondary | ICD-10-CM | POA: Insufficient documentation

## 2015-01-13 DIAGNOSIS — E786 Lipoprotein deficiency: Secondary | ICD-10-CM | POA: Insufficient documentation

## 2015-01-13 DIAGNOSIS — R7303 Prediabetes: Secondary | ICD-10-CM | POA: Insufficient documentation

## 2015-06-04 DIAGNOSIS — E559 Vitamin D deficiency, unspecified: Secondary | ICD-10-CM | POA: Insufficient documentation

## 2015-12-28 ENCOUNTER — Emergency Department (HOSPITAL_BASED_OUTPATIENT_CLINIC_OR_DEPARTMENT_OTHER): Payer: Managed Care, Other (non HMO)

## 2015-12-28 ENCOUNTER — Encounter (HOSPITAL_BASED_OUTPATIENT_CLINIC_OR_DEPARTMENT_OTHER): Payer: Self-pay | Admitting: *Deleted

## 2015-12-28 ENCOUNTER — Emergency Department (HOSPITAL_BASED_OUTPATIENT_CLINIC_OR_DEPARTMENT_OTHER)
Admission: EM | Admit: 2015-12-28 | Discharge: 2015-12-28 | Disposition: A | Discharge status: Home / Self Care | Payer: Managed Care, Other (non HMO) | Attending: Emergency Medicine | Admitting: Emergency Medicine

## 2015-12-28 DIAGNOSIS — M25552 Pain in left hip: Secondary | ICD-10-CM | POA: Diagnosis present

## 2015-12-28 DIAGNOSIS — Z791 Long term (current) use of non-steroidal anti-inflammatories (NSAID): Secondary | ICD-10-CM | POA: Diagnosis not present

## 2015-12-28 MED ORDER — NAPROXEN 250 MG PO TABS
500.0000 mg | ORAL_TABLET | Freq: Once | ORAL | Status: AC
  Administered 2015-12-28: 500 mg via ORAL
  Filled 2015-12-28: qty 2

## 2015-12-28 MED ORDER — NAPROXEN 500 MG PO TABS: 500.0000 mg | ORAL_TABLET | Freq: Two times a day (BID) | ORAL | 0 refills | Status: DC

## 2015-12-28 NOTE — ED Triage Notes (Signed)
Pt reports chronic L hip pain -- current flare-up began about 3 mos ago. Reports Motrin ineffective. Pt able to ambulate. Denies known injury.

## 2015-12-28 NOTE — Discharge Instructions (Signed)
Medications: Naprosyn  Treatment: Take Naprosyn twice daily for 1 week. Use moist heat 3-4 times daily alternating 20 minutes on, 20 minutes off. Stretch your hip and low back using some of the exercises indicated below.  Follow-up: Please follow-up with Dr. Barbaraann Barthel as soon as possible for further evaluation and treatment of your hip pain. Please return to the emergency department if you develop any new or worsening symptoms.

## 2015-12-28 NOTE — ED Provider Notes (Signed)
[facility] Provider Note   CSN: [id number] Arrival date & time: 12/28/15  0945  First Provider Contact:  None       History   Chief Complaint Chief Complaint  Patient presents with  . Hip Pain    HPI [name] is a 40 y.o. male with history of chronic low back and left hip pain who presents with left posterior hip pain. Patient states that he was on a cruise in May and felt a pop in his left hip. Patient states he has had intermittent pain since, that presents itself when standing up. Patient states that his left hip gets tight or coughs with this movement. Patient has no pain when sitting. Patient has not seen an orthopedic doctor for his low back or hip problems. Patient reports associated soreness in his legs and knees when he first stands up which resolves and he starts moving. Patient states he saw his PCP last year for his hip who wanted the patient to get x-rays, however he never did. Patient has been taking Tylenol extra strength and took one muscle relaxer without relief. Patient is unsure which muscle relaxer he has been taking. Patient denies any numbness or tingling, saddle anesthesia, bowel/bladder incontinence, fever or chills, chest pain, shortness of breath, abdominal pain, nausea, vomiting, urinary symptoms. Patient was seen in emergency department 2-3 years ago and had x-rays of his lumbar spine which showed mild arthritis.  HPI  Past Medical History:  Diagnosis Date  . Eczema     There are no active problems to display for this patient.   Past Surgical History:  Procedure Laterality Date  . KNEE SURGERY     right  . WRIST FRACTURE SURGERY         Home Medications    Prior to Admission medications   Medication Sig Start Date End Date Taking? Authorizing Provider  ibuprofen (ADVIL,MOTRIN) 800 MG tablet Take 1 tablet (800 mg total) by mouth 3 (three) times daily. 03/17/13  Yes Drenda Freeze, MD  hydrocortisone cream 1 % Apply 1  application topically daily as needed (for dry skin.).    Historical Provider, MD  naproxen (NAPROSYN) 500 MG tablet Take 1 tablet (500 mg total) by mouth 2 (two) times daily. 12/28/15   Frederica Kuster, PA-C  ranitidine (ZANTAC) 150 MG tablet Take 150 mg by mouth daily as needed for heartburn.    Historical Provider, MD    Family History No family history on file.  Social History Social History  Substance Use Topics  . Smoking status: Never Smoker  . Smokeless tobacco: Never Used  . Alcohol use No     Allergies   Review of patient's allergies indicates no known allergies.   Review of Systems Review of Systems  Constitutional: Negative for chills and fever.  HENT: Negative for facial swelling.   Respiratory: Negative for shortness of breath.   Cardiovascular: Negative for chest pain.  Gastrointestinal: Negative for abdominal pain, nausea and vomiting.  Genitourinary: Negative for dysuria.  Musculoskeletal: Positive for arthralgias (L hip) and back pain (chronic).  Skin: Negative for rash and wound.  Neurological: Negative for headaches.  Psychiatric/Behavioral: The patient is not nervous/anxious.      Physical Exam Updated Vital Signs BP 150/74 (BP Location: Left Arm)   Pulse 83   Temp 98.2 F (36.8 C) (Oral)   Resp 20   Ht 6\' 6"  (1.981 m)   Wt (!) 188.2 kg   SpO2 99%   BMI  47.96 kg/m   Physical Exam  Constitutional: He appears well-developed and well-nourished. No distress.  Morbidly obese  HENT:  Head: Normocephalic and atraumatic.  Mouth/Throat: Oropharynx is clear and moist. No oropharyngeal exudate.  Eyes: Conjunctivae are normal. Pupils are equal, round, and reactive to light. Right eye exhibits no discharge. Left eye exhibits no discharge. No scleral icterus.  Neck: Normal range of motion. Neck supple. No thyromegaly present.  Cardiovascular: Normal rate, regular rhythm, normal heart sounds and intact distal pulses.  Exam reveals no gallop and no  friction rub.   No murmur heard. Pulmonary/Chest: Effort normal and breath sounds normal. No stridor. No respiratory distress. He has no wheezes. He has no rales.  Abdominal: Soft. Bowel sounds are normal. He exhibits no distension. There is no tenderness. There is no rebound and no guarding.  Musculoskeletal: He exhibits no edema.       Left hip: He exhibits tenderness (posterior). He exhibits normal strength and no bony tenderness.       Lumbar back: He exhibits no bony tenderness.       Back:  Patient with tenderness as indicated on the line; negative straight leg raise bilaterally; 5/5 strength in lower extremities; normal sensation to lower extremities; pain made worse in the left posterior hip with lumbar flexion and knee flexion; no bony tenderness to palpation of entire spine; patient can heel raise and toe raise without difficulty  Lymphadenopathy:    He has no cervical adenopathy.  Neurological: He is alert. Coordination normal.  Skin: Skin is warm and dry. No rash noted. He is not diaphoretic. No pallor.  Psychiatric: He has a normal mood and affect.  Nursing note and vitals reviewed.    ED Treatments / Results  Labs (all labs ordered are listed, but only abnormal results are displayed) Labs Reviewed - No data to display  EKG  EKG Interpretation None       Radiology Dg Hip Unilat W Or Wo Pelvis 2-3 Views Left  Result Date: 12/28/2015 CLINICAL DATA:  Three-month history of left hip region pain EXAM: DG HIP (WITH OR WITHOUT PELVIS) 2-3V LEFT COMPARISON:  None. FINDINGS: Frontal pelvis as well as frontal and lateral left hip images were obtained. There is mild symmetric narrowing of both hip joints. No fracture or dislocation. No erosive change. IMPRESSION: No fracture or dislocation.  Symmetric narrowing both hip joints. Electronically Signed   By: Lowella Grip III M.D.   On: 12/28/2015 10:46   Procedures Procedures (including critical care time)  Medications  Ordered in ED Medications  naproxen (NAPROSYN) tablet 500 mg (500 mg Oral Given 12/28/15 1025)     Initial Impression / Assessment and Plan / ED Course  I have reviewed the triage vital signs and the nursing notes.  Pertinent labs & imaging results that were available during my care of the patient were reviewed by me and considered in my medical decision making (see chart for details).  Clinical Course   Patient given Naprosyn 500 mg in ED.   Final Clinical Impressions(s) / ED Diagnoses   X-rays are negative for any acute abnormality. I suspect muscle strain and obesity as cause of patient's symptoms. I will treat patient with symptomatic treatment, including Naprosyn for 1 week, moist heat, and back/hip exercises. Patient also advised to follow-up with Dr. Barbaraann Barthel with sports medicine for further evaluation and treatment. Patient understands and agrees with plan. I discussed patient with Dr. Darl Householder who is in agreement with plan. Patient vitals stable throughout  ED course and discharged in satisfactory condition.  Final diagnoses:  Left hip pain    New Prescriptions New Prescriptions   NAPROXEN (NAPROSYN) 500 MG TABLET    Take 1 tablet (500 mg total) by mouth 2 (two) times daily.     Frederica Kuster, PA-C 12/28/15 1104    Drenda Freeze, MD 12/28/15 [phone]

## 2016-01-07 ENCOUNTER — Encounter: Payer: Self-pay | Admitting: Family Medicine

## 2016-01-07 ENCOUNTER — Ambulatory Visit (INDEPENDENT_AMBULATORY_CARE_PROVIDER_SITE_OTHER): Payer: Managed Care, Other (non HMO) | Admitting: Family Medicine

## 2016-01-07 DIAGNOSIS — M5416 Radiculopathy, lumbar region: Secondary | ICD-10-CM | POA: Diagnosis not present

## 2016-01-07 MED ORDER — OXYCODONE-ACETAMINOPHEN 5-325 MG PO TABS: 1.0000 | ORAL_TABLET | Freq: Four times a day (QID) | ORAL | 0 refills | Status: DC | PRN

## 2016-01-07 MED ORDER — PREDNISONE 10 MG PO TABS: ORAL_TABLET | ORAL | 0 refills | Status: DC

## 2016-01-07 NOTE — Patient Instructions (Signed)
Call [facility] to get the MRI straightened out - I think this is the important next step for you to have an MRI of your lumbar spine. You have lumbar radiculopathy (a pinched nerve in your low back). A prednisone dose pack is the best option for immediate relief and may be prescribed.  Don't take aleve, naproxen, or ibuprofen when taking the prednisone. Oxycodone as needed for severe pain (no driving on this medicine). Stay as active as possible. Strengthening of low back muscles, abdominal musculature are key for long term pain relief. Call me after you have the MRI and I'll look over the results.

## 2016-01-13 DIAGNOSIS — M79605 Pain in left leg: Secondary | ICD-10-CM | POA: Insufficient documentation

## 2016-01-13 DIAGNOSIS — M545 Low back pain, unspecified: Secondary | ICD-10-CM | POA: Insufficient documentation

## 2016-01-13 NOTE — Assessment & Plan Note (Signed)
history and exam consistent with left sided lumbar radiculopathy.  He has already tried prednisone dose pack, naprosyn, an injection (? If was toradol or steroid).  His PCP actually ordered an MRI which I recommended as next step - he will try to get this sorted out with PCP.  Advised him to call me after this to review the results.  Will try the extended course of prednisone in meantime with oxycodone prn.

## 2016-01-13 NOTE — Progress Notes (Signed)
PCP: Isaias Cowman PA-C  Subjective:   HPI: Patient is a 40 y.o. male here for left hip, low back pain.  Patient reports for about 2 months he's had low back pain into left hip and groin area. Started when he got up from a bench and felt a pop in his lower back. Has worsened quite a bit over last 2 weeks. Pain level 10/10, sharp. Tried naprosyn, ?toradol injection, and steroid dose pack and still struggling with pain. Associated spasms in low back. PCP set up an MRI though some confusion - imaging place called him about a CT scan - has not had any advanced imaging done yet. No bowel dysfunction.  Has to urinate more than normal. No skin changes, numbness.  Past Medical History:  Diagnosis Date  . Eczema     Current Outpatient Prescriptions on File Prior to Visit  Medication Sig Dispense Refill  . hydrocortisone cream 1 % Apply 1 application topically daily as needed (for dry skin.).    Marland Kitchen ranitidine (ZANTAC) 150 MG tablet Take 150 mg by mouth daily as needed for heartburn.     No current facility-administered medications on file prior to visit.     Past Surgical History:  Procedure Laterality Date  . KNEE SURGERY     right  . WRIST FRACTURE SURGERY      No Known Allergies  Social History   Social History  . Marital status: Married    Spouse name: N/A  . Number of children: N/A  . Years of education: N/A   Occupational History  . Not on file.   Social History Main Topics  . Smoking status: Never Smoker  . Smokeless tobacco: Never Used  . Alcohol use No  . Drug use: No  . Sexual activity: Not on file   Other Topics Concern  . Not on file   Social History Narrative  . No narrative on file    No family history on file.  BP (!) 149/81   Pulse 71   Ht 6\' 6"  (1.981 m)   Wt (!) 415 lb (188.2 kg)   BMI 47.96 kg/m   Review of Systems: See HPI above.    Objective:  Physical Exam:  Gen: NAD, uncomfortable in exam room  Back: No gross deformity,  scoliosis. TTP L > R paraspinal lumbar regions.  No midline or bony TTP. FROM but pain on flexion and extension. Strength LEs 5/5 all muscle groups.   Trace MSRs in patellar and achilles tendons, equal bilaterally. Positive SLR on left, negative right. Sensation intact to light touch bilaterally. Negative logroll bilateral hips    Assessment & Plan:  1. Lumbar radiculopathy - history and exam consistent with left sided lumbar radiculopathy.  He has already tried prednisone dose pack, naprosyn, an injection (? If was toradol or steroid).  His PCP actually ordered an MRI which I recommended as next step - he will try to get this sorted out with PCP.  Advised him to call me after this to review the results.  Will try the extended course of prednisone in meantime with oxycodone prn.

## 2016-01-22 ENCOUNTER — Ambulatory Visit: Payer: Managed Care, Other (non HMO) | Admitting: Family Medicine

## 2016-01-25 ENCOUNTER — Encounter: Payer: Self-pay | Admitting: Family Medicine

## 2016-01-25 ENCOUNTER — Ambulatory Visit (INDEPENDENT_AMBULATORY_CARE_PROVIDER_SITE_OTHER): Payer: Managed Care, Other (non HMO) | Admitting: Family Medicine

## 2016-01-25 VITALS — BP 136/78 | HR 82 | Ht 77.0 in | Wt >= 6400 oz

## 2016-01-25 DIAGNOSIS — M79605 Pain in left leg: Secondary | ICD-10-CM

## 2016-01-25 DIAGNOSIS — M545 Low back pain, unspecified: Secondary | ICD-10-CM

## 2016-01-25 DIAGNOSIS — M5416 Radiculopathy, lumbar region: Secondary | ICD-10-CM | POA: Diagnosis not present

## 2016-01-25 MED ORDER — NORTRIPTYLINE HCL 25 MG PO CAPS: 25.0000 mg | ORAL_CAPSULE | Freq: Every day | ORAL | 2 refills | Status: DC

## 2016-01-25 NOTE — Patient Instructions (Signed)
Your MRI looks really good. This is consistent with severe lumbar strain. Let's try nortriptyline 25mg  at bedtime - we can go up on this if it's helping but not enough. Start physical therapy and do home exercises on days you don't go to therapy. Ibuprofen 600mg  three times a day with food OR aleve 2 tabs twice a day with food for pain and inflammation. Follow up with me about 1 month after starting therapy.

## 2016-01-27 NOTE — Assessment & Plan Note (Signed)
MRI is reassuring - no evidence source of radiculopathy.  Consistent with severe lumbar strain, possible irritated peripheral nerve.  Will try nortriptyline, physical therapy, nsaids as needed.  F/u in 1 month after start of therapy.  He is in process of having bariatric surgery as well which should help with his back pain.

## 2016-01-27 NOTE — Progress Notes (Signed)
PCP: Isaias Cowman PA-C  Subjective:   HPI: Patient is a 40 y.o. male here for left hip, low back pain.  8/10: Patient reports for about 2 months he's had low back pain into left hip and groin area. Started when he got up from a bench and felt a pop in his lower back. Has worsened quite a bit over last 2 weeks. Pain level 10/10, sharp. Tried naprosyn, ?toradol injection, and steroid dose pack and still struggling with pain. Associated spasms in low back. PCP set up an MRI though some confusion - imaging place called him about a CT scan - has not had any advanced imaging done yet. No bowel dysfunction.  Has to urinate more than normal. No skin changes, numbness.  8/28: Patient reports some mild improvement since last visit. Pain is 6/10 level, still sharp. Radiates into left hip and groin. Had MRI - report found in care everywhere and essentially normal except some mild facet changes. No evidence of radiculopathy. Finished extended prednisone course.  Past Medical History:  Diagnosis Date  . Eczema     Current Outpatient Prescriptions on File Prior to Visit  Medication Sig Dispense Refill  . hydrocortisone cream 1 % Apply 1 application topically daily as needed (for dry skin.).    . Insulin Pen Needle (PEN NEEDLES) 31G X 6 MM MISC Once daily as directed    . Liraglutide -Weight Management 18 MG/3ML SOPN 41ml/18 mg route subcutaneous    . metFORMIN (GLUCOPHAGE-XR) 750 MG 24 hr tablet     . oxyCODONE-acetaminophen (PERCOCET/ROXICET) 5-325 MG tablet Take 1 tablet by mouth every 6 (six) hours as needed for severe pain. 40 tablet 0  . ranitidine (ZANTAC) 150 MG tablet Take 150 mg by mouth daily as needed for heartburn.     No current facility-administered medications on file prior to visit.     Past Surgical History:  Procedure Laterality Date  . KNEE SURGERY     right  . WRIST FRACTURE SURGERY      No Known Allergies  Social History   Social History  . Marital status:  Married    Spouse name: N/A  . Number of children: N/A  . Years of education: N/A   Occupational History  . Not on file.   Social History Main Topics  . Smoking status: Never Smoker  . Smokeless tobacco: Never Used  . Alcohol use No  . Drug use: No  . Sexual activity: Not on file   Other Topics Concern  . Not on file   Social History Narrative  . No narrative on file    No family history on file.  BP 136/78   Pulse 82   Ht 6\' 5"  (1.956 m)   Wt (!) 407 lb (184.6 kg)   BMI 48.26 kg/m   Review of Systems: See HPI above.    Objective:  Physical Exam:  Gen: NAD, uncomfortable in exam room  Back: No gross deformity, scoliosis. TTP L > R paraspinal lumbar regions.  No midline or bony TTP. FROM but pain on flexion and extension. Strength LEs 5/5 all muscle groups.   Trace MSRs in patellar and achilles tendons, equal bilaterally. Positive SLR on left, negative right. Sensation intact to light touch bilaterally. Negative logroll bilateral hips    Assessment & Plan:  1. Low back pain radiating to left leg - MRI is reassuring - no evidence source of radiculopathy.  Consistent with severe lumbar strain, possible irritated peripheral nerve.  Will try  nortriptyline, physical therapy, nsaids as needed.  F/u in 1 month after start of therapy.  He is in process of having bariatric surgery as well which should help with his back pain.

## 2016-02-02 ENCOUNTER — Ambulatory Visit: Payer: Managed Care, Other (non HMO) | Attending: Family Medicine

## 2017-01-11 ENCOUNTER — Emergency Department (HOSPITAL_BASED_OUTPATIENT_CLINIC_OR_DEPARTMENT_OTHER): Payer: Self-pay

## 2017-01-11 ENCOUNTER — Encounter (HOSPITAL_BASED_OUTPATIENT_CLINIC_OR_DEPARTMENT_OTHER): Payer: Self-pay | Admitting: Emergency Medicine

## 2017-01-11 ENCOUNTER — Emergency Department (HOSPITAL_BASED_OUTPATIENT_CLINIC_OR_DEPARTMENT_OTHER)
Admission: EM | Admit: 2017-01-11 | Discharge: 2017-01-11 | Disposition: A | Discharge status: Home / Self Care | Payer: Self-pay | Attending: Emergency Medicine | Admitting: Emergency Medicine

## 2017-01-11 DIAGNOSIS — Z7984 Long term (current) use of oral hypoglycemic drugs: Secondary | ICD-10-CM | POA: Insufficient documentation

## 2017-01-11 DIAGNOSIS — Z79899 Other long term (current) drug therapy: Secondary | ICD-10-CM | POA: Insufficient documentation

## 2017-01-11 DIAGNOSIS — R079 Chest pain, unspecified: Secondary | ICD-10-CM | POA: Insufficient documentation

## 2017-01-11 LAB — CBC
HCT: 42.1 % (ref 39.0–52.0)
Hemoglobin: 13.6 g/dL (ref 13.0–17.0)
MCH: 28.5 pg (ref 26.0–34.0)
MCHC: 32.3 g/dL (ref 30.0–36.0)
MCV: 88.3 fL (ref 78.0–100.0)
Platelets: 305 10*3/uL (ref 150–400)
RBC: 4.77 MIL/uL (ref 4.22–5.81)
RDW: 14.3 % (ref 11.5–15.5)
WBC: 9.9 10*3/uL (ref 4.0–10.5)

## 2017-01-11 LAB — BASIC METABOLIC PANEL
Anion gap: 6 (ref 5–15)
BUN: 10 mg/dL (ref 6–20)
CO2: 26 mmol/L (ref 22–32)
Calcium: 8.7 mg/dL — ABNORMAL LOW (ref 8.9–10.3)
Chloride: 105 mmol/L (ref 101–111)
Creatinine, Ser: 0.9 mg/dL (ref 0.61–1.24)
GFR calc Af Amer: >60 mL/min (ref >60)
GFR calc non Af Amer: >60 mL/min (ref >60)
Glucose, Bld: 128 mg/dL — ABNORMAL HIGH (ref 65–99)
Potassium: 4.2 mmol/L (ref 3.5–5.1)
Sodium: 137 mmol/L (ref 135–145)

## 2017-01-11 LAB — TROPONIN I: Troponin I: <0.03 ng/mL (ref <0.03)

## 2017-01-11 MED ORDER — ALUM & MAG HYDROXIDE-SIMETH 200-200-20 MG/5ML PO SUSP
30.0000 mL | Freq: Once | ORAL | Status: AC
  Administered 2017-01-11: 30 mL via ORAL
  Filled 2017-01-11: qty 30

## 2017-01-11 NOTE — ED Triage Notes (Signed)
R side Chest pain x 1 week, SOB started today.

## 2017-01-11 NOTE — ED Provider Notes (Signed)
[place] [facility] Provider Note   CSN: [id number] Arrival date & time: 01/11/17  0841     History   Chief Complaint Chief Complaint  Patient presents with  . Chest Pain    HPI [name] is a 41 y.o. male.  HPI Patient is a 41 year old morbid obese male presents emergency department with complaints of sharp right anterior chest discomfort over the past week.  There has been some consistency to it with intermittent times where his pain worsens.  Denies shortness of breath.  Denies nausea vomiting.  Reports some radiation of the pain towards his right arm yesterday.  Comes on at rest.  Does not always come on with exertion.  He has been walking as been watching what he eats and is dropped nearly 60 pounds in the past 6-8 months.  He is working on scheduling bariatric surgery in [facility].  He denies fevers and chills.  No abdominal pain or discomfort.  Denies back pain.   Past Medical History:  Diagnosis Date  . Eczema     Patient Active Problem List   Diagnosis Date Noted  . Low back pain radiating to left leg 01/13/2016  . Low HDL (under 40) 01/13/2015  . Prediabetes 01/13/2015  . Fatigue 08/13/2014  . Hypersomnia 08/13/2014  . Snoring 08/13/2014    Past Surgical History:  Procedure Laterality Date  . KNEE SURGERY     right  . WRIST FRACTURE SURGERY         Home Medications    Prior to Admission medications   Medication Sig Start Date End Date Taking? Authorizing Provider  hydrocortisone cream 1 % Apply 1 application topically daily as needed (for dry skin.).    [provider]  Insulin Pen Needle (PEN NEEDLES) 31G X 6 MM MISC Once daily as directed 07/13/15   [provider]  Liraglutide -Weight Management 18 MG/3ML SOPN 64ml/18 mg route subcutaneous 06/03/15   [provider]  metFORMIN (GLUCOPHAGE-XR) 750 MG 24 hr tablet  11/27/15   [provider]  nortriptyline (PAMELOR) 25 MG capsule Take 1 capsule  (25 mg total) by mouth at bedtime. 01/25/16   Hudnall, Sharyn Lull, MD  oxyCODONE-acetaminophen (PERCOCET/ROXICET) 5-325 MG tablet Take 1 tablet by mouth every 6 (six) hours as needed for severe pain. 01/07/16   Hudnall, Sharyn Lull, MD  ranitidine (ZANTAC) 150 MG tablet Take 150 mg by mouth daily as needed for heartburn.    [provider]    Family History No family history on file.  Social History Social History  Substance Use Topics  . Smoking status: Never Smoker  . Smokeless tobacco: Never Used  . Alcohol use No     Allergies   Patient has no known allergies.   Review of Systems Review of Systems  All other systems reviewed and are negative.    Physical Exam Updated Vital Signs BP 121/80   Pulse 74   Temp 97.9 F (36.6 C) (Oral)   Resp 13   Ht 6\' 5"  (1.956 m)   Wt (!) 217.7 kg (480 lb)   SpO2 100%   BMI 56.92 kg/m   Physical Exam  Constitutional: He is oriented to person, place, and time. He appears well-developed and well-nourished.  Morbidly obese  HENT:  Head: Normocephalic and atraumatic.  Eyes: EOM are normal.  Neck: Normal range of motion.  Cardiovascular: Normal rate, regular rhythm, normal heart sounds and intact distal pulses.   Pulmonary/Chest: Effort normal and breath sounds  normal. No respiratory distress.  Abdominal: Soft. He exhibits no distension. There is no tenderness.  Musculoskeletal: Normal range of motion.  Neurological: He is alert and oriented to person, place, and time.  Skin: Skin is warm and dry.  Psychiatric: He has a normal mood and affect. Judgment normal.  Nursing note and vitals reviewed.    ED Treatments / Results  Labs (all labs ordered are listed, but only abnormal results are displayed) Labs Reviewed  BASIC METABOLIC PANEL - Abnormal; Notable for the following:       Result Value   Glucose, Bld 128 (*)    Calcium 8.7 (*)    All other components within normal limits  CBC  TROPONIN I    EKG  EKG  Interpretation  Date/Time:  Wednesday January 11 2017 08:49:06 EDT Ventricular Rate:  81 PR Interval:    QRS Duration: 97 QT Interval:  378 QTC Calculation: 439 R Axis:   45 Text Interpretation:  Sinus rhythm Low voltage, precordial leads No significant change was found Confirmed by Jola Schmidt [phone]) on 01/11/2017 9:02:30 AM Also confirmed by Jola Schmidt [phone]), editor Laurena Spies [phone])  on 01/11/2017 9:11:41 AM       Radiology Dg Chest 2 View  Result Date: 01/11/2017 CLINICAL DATA:  Intermittent chest pain over the last 2 weeks. Shortness of breath. EXAM: CHEST  2 VIEW COMPARISON:  08/12/2012 FINDINGS: Heart size is normal. Mediastinal shadows are normal. The lungs are clear. No effusions. Ordinary degenerative changes affect the spine. IMPRESSION: Normal chest Electronically Signed   By: Nelson Chimes M.D.   On: 01/11/2017 09:20    Procedures Procedures (including critical care time)  Medications Ordered in ED Medications  alum & mag hydroxide-simeth (MAALOX/MYLANTA) 200-200-20 MG/5ML suspension 30 mL (30 mLs Oral Given 01/11/17 0935)     Initial Impression / Assessment and Plan / ED Course  I have reviewed the triage vital signs and the nursing notes.  Pertinent labs & imaging results that were available during my care of the patient were reviewed by me and considered in my medical decision making (see chart for details).     Nonspecific chest pain.  Doubt ACS.  Doubt PE.  Patient is PERC negative.  Close primary care follow-up.  No indication for additional workup in the emergency department.  Denies shortness of breath.  Reports pain moves from left side the right-sided chest.  Recommended anti-inflammatories and primary care evaluation.  Patient understands return to the ER for new or worsening symptoms  Final Clinical Impressions(s) / ED Diagnoses   Final diagnoses:  Nonspecific chest pain    New Prescriptions New Prescriptions   No medications on file       Jola Schmidt, MD 01/11/17 1022

## 2017-01-16 ENCOUNTER — Emergency Department (HOSPITAL_COMMUNITY): Payer: Managed Care, Other (non HMO)

## 2017-01-16 ENCOUNTER — Emergency Department (HOSPITAL_COMMUNITY)
Admission: EM | Admit: 2017-01-16 | Discharge: 2017-01-16 | Disposition: A | Discharge status: Home / Self Care | Payer: Managed Care, Other (non HMO) | Attending: Emergency Medicine | Admitting: Emergency Medicine

## 2017-01-16 DIAGNOSIS — R0789 Other chest pain: Secondary | ICD-10-CM | POA: Insufficient documentation

## 2017-01-16 DIAGNOSIS — R0602 Shortness of breath: Secondary | ICD-10-CM | POA: Insufficient documentation

## 2017-01-16 LAB — CBC
HCT: 45.1 % (ref 39.0–52.0)
Hemoglobin: 14.9 g/dL (ref 13.0–17.0)
MCH: 29.1 pg (ref 26.0–34.0)
MCHC: 33 g/dL (ref 30.0–36.0)
MCV: 88.1 fL (ref 78.0–100.0)
Platelets: 320 10*3/uL (ref 150–400)
RBC: 5.12 MIL/uL (ref 4.22–5.81)
RDW: 13.9 % (ref 11.5–15.5)
WBC: 9.5 10*3/uL (ref 4.0–10.5)

## 2017-01-16 LAB — BASIC METABOLIC PANEL
Anion gap: 6 (ref 5–15)
BUN: 9 mg/dL (ref 6–20)
CO2: 27 mmol/L (ref 22–32)
Calcium: 9.2 mg/dL (ref 8.9–10.3)
Chloride: 103 mmol/L (ref 101–111)
Creatinine, Ser: 0.93 mg/dL (ref 0.61–1.24)
GFR calc Af Amer: >60 mL/min (ref >60)
GFR calc non Af Amer: >60 mL/min (ref >60)
Glucose, Bld: 111 mg/dL — ABNORMAL HIGH (ref 65–99)
Potassium: 4.5 mmol/L (ref 3.5–5.1)
Sodium: 136 mmol/L (ref 135–145)

## 2017-01-16 LAB — BRAIN NATRIURETIC PEPTIDE: B Natriuretic Peptide: 28.3 pg/mL (ref 0.0–100.0)

## 2017-01-16 LAB — POCT I-STAT TROPONIN I
Troponin i, poc: 0 ng/mL (ref 0.00–0.08)
Troponin i, poc: 0 ng/mL (ref 0.00–0.08)

## 2017-01-16 MED ORDER — KETOROLAC TROMETHAMINE 15 MG/ML IJ SOLN
15.0000 mg | Freq: Once | INTRAMUSCULAR | Status: AC
  Administered 2017-01-16: 15 mg via INTRAVENOUS
  Filled 2017-01-16: qty 1

## 2017-01-16 MED ORDER — GI COCKTAIL ~~LOC~~
30.0000 mL | Freq: Once | ORAL | Status: AC
  Administered 2017-01-16: 30 mL via ORAL
  Filled 2017-01-16: qty 30

## 2017-01-16 MED ORDER — CYCLOBENZAPRINE HCL 10 MG PO TABS: 10.0000 mg | ORAL_TABLET | Freq: Two times a day (BID) | ORAL | 0 refills | Status: DC | PRN

## 2017-01-16 NOTE — ED Triage Notes (Signed)
Pt states that he has had CP/SOB since Wednesday but it has become more constant. Unable to get in touch with PCP. Alert and oriented.

## 2017-01-16 NOTE — Discharge Instructions (Signed)
Your lab work and imaging has been reassuring. It is important that she follow-up with her primary care doctor.may need further cardiology workup if symptoms persist. Encouraged NSAIDs, warm compresses. We'll give him a short course of muscle relaxers to see if this helps her pain. Return to the ED if your symptoms worsen.

## 2017-01-16 NOTE — ED Notes (Signed)
Pt states that he has been having  right and left sided chest pain with associated SOB x 3 weeks and has been progressive  7/10 pain described as sharp, pt last night that he had an episode in which pain radiated down left arm and had numbness and tingling in left hand. Pt states that he was recently seen last week wed at [facility] for similar concern and states chest x ray was normal. Pt is alert no apparent distress is noted.

## 2017-01-16 NOTE — ED Notes (Signed)
Attempted to get IV x2 unable to obtain, [place] IV RN made aware

## 2017-01-16 NOTE — ED Provider Notes (Signed)
[place] DEPT Provider Note   CSN: [id number] Arrival date & time: 01/16/17  8250     History   Chief Complaint Chief Complaint  Patient presents with  . Chest Pain  . Shortness of Breath    HPI [name] is a 41 y.o. male.  HPI 41 year old male past medical history significant for morbid obesity presents to the emergency Department today with complaints of chest pain. Patient states that his chest pain has been ongoing for the past 6 months. It is intermittent. States that the pain can be left-sided at times then right side of times. Patient reports mild shortness of breath. Not pleuritic in nature. Patient states he had episode last night described as sharp in nature. Radiates to his left arm. This has since resolved before seeing me. CP come with exertion and at rest. Patient states that he has dropped 60 pounds in past 6-8 months from dieting. Is trying to schedule bariatric surgery. Patient denies any history of PE/DVT, prolonged immobilization, recent hospitalizations, unilateral leg swelling, calf tenderness, tobacco use. Denies any history of diabetes, hypertension, hypercholesterolemia. Does report a significant family history of cardiac disease.  The patient has not tried anything for his symptoms. The chest pain will self resolve at times. Denies any lower extremity edema.  Of note patient was seen in the ED on 8/15 for same. Had negative workup at that time. Patient has not followed up with his primary care doctor as of yet. At that time patient was diagnosed with  Chest wall pain likely of musculoskeletal etiology.  Pt denies any fever, chill, ha, vision changes, lightheadedness, dizziness, congestion, neck pain, cough, abd pain, n/v/d, urinary symptoms, change in bowel habits, melena, hematochezia, lower extremity paresthesias.  Past Medical History:  Diagnosis Date  . Eczema     Patient Active Problem List   Diagnosis Date Noted  . Low back pain radiating  to left leg 01/13/2016  . Low HDL (under 40) 01/13/2015  . Prediabetes 01/13/2015  . Fatigue 08/13/2014  . Hypersomnia 08/13/2014  . Snoring 08/13/2014    Past Surgical History:  Procedure Laterality Date  . KNEE SURGERY     right  . WRIST FRACTURE SURGERY         Home Medications    Prior to Admission medications   Medication Sig Start Date End Date Taking? Authorizing Provider  naproxen sodium (ANAPROX) 220 MG tablet Take 440 mg by mouth daily as needed (for pain).   Yes [provider]  cyclobenzaprine (FLEXERIL) 10 MG tablet Take 1 tablet (10 mg total) by mouth 2 (two) times daily as needed for muscle spasms. 01/16/17   Doristine Devoid, PA-C  nortriptyline (PAMELOR) 25 MG capsule Take 1 capsule (25 mg total) by mouth at bedtime. Patient not taking: Reported on 01/16/2017 01/25/16   Dene Gentry, MD    Family History No family history on file.  Social History Social History  Substance Use Topics  . Smoking status: Never Smoker  . Smokeless tobacco: Never Used  . Alcohol use No     Allergies   Patient has no known allergies.   Review of Systems Review of Systems  Constitutional: Negative for chills, diaphoresis and fever.  HENT: Negative for congestion.   Eyes: Negative for visual disturbance.  Respiratory: Positive for shortness of breath. Negative for cough and wheezing.   Cardiovascular: Positive for chest pain. Negative for palpitations and leg swelling.  Gastrointestinal: Negative for abdominal pain, diarrhea, nausea and vomiting.  Genitourinary:  Negative for dysuria, flank pain, frequency, hematuria and urgency.  Musculoskeletal: Negative for arthralgias and myalgias.  Skin: Negative for rash.  Neurological: Negative for dizziness, syncope, weakness, light-headedness, numbness and headaches.  Psychiatric/Behavioral: Negative for sleep disturbance. The patient is not nervous/anxious.      Physical Exam Updated Vital Signs BP 129/85  (BP Location: Left Arm)   Pulse 93   Temp 97.8 F (36.6 C) (Oral)   Resp (!) 26   Ht 6\' 5"  (1.956 m)   Wt (!) 217.7 kg (480 lb)   SpO2 98%   BMI 56.92 kg/m   Physical Exam  Constitutional: He is oriented to person, place, and time. He appears well-developed and well-nourished.  Non-toxic appearance. No distress.  Morbid obesity  HENT:  Head: Normocephalic and atraumatic.  Nose: Nose normal.  Mouth/Throat: Oropharynx is clear and moist.  Eyes: Pupils are equal, round, and reactive to light. Conjunctivae are normal. Right eye exhibits no discharge. Left eye exhibits no discharge.  Neck: Normal range of motion. Neck supple. No JVD present. No tracheal deviation present.  Cardiovascular: Normal rate, regular rhythm, normal heart sounds and intact distal pulses.  Exam reveals no gallop and no friction rub.   No murmur heard. Pulmonary/Chest: Effort normal and breath sounds normal. No respiratory distress. He has no wheezes. He has no rales. He exhibits tenderness (anterior chest wall.).  No hypoxia or tachypnea.  Abdominal: Soft. Bowel sounds are normal. He exhibits no distension. There is no tenderness. There is no rebound and no guarding.  Musculoskeletal: Normal range of motion. He exhibits no tenderness.  No lower extremity edema or calf tenderness.  Lymphadenopathy:    He has no cervical adenopathy.  Neurological: He is alert and oriented to person, place, and time.  Skin: Skin is warm and dry. Capillary refill takes less than 2 seconds. No rash noted. He is not diaphoretic.  Psychiatric: His behavior is normal. Judgment and thought content normal.  Nursing note and vitals reviewed.    ED Treatments / Results  Labs (all labs ordered are listed, but only abnormal results are displayed) Labs Reviewed  BASIC METABOLIC PANEL - Abnormal; Notable for the following:       Result Value   Glucose, Bld 111 (*)    All other components within normal limits  CBC  BRAIN NATRIURETIC  PEPTIDE  I-STAT TROPONIN, ED  POCT I-STAT TROPONIN I  I-STAT TROPONIN, ED    EKG  EKG Interpretation  Date/Time:  Monday January 16 2017 09:07:35 EDT Ventricular Rate:  79 PR Interval:    QRS Duration: 93 QT Interval:  372 QTC Calculation: 427 R Axis:   34 Text Interpretation:  Sinus rhythm Low voltage, precordial leads Baseline wander in lead(s) I II aVR No significant change since last tracing Confirmed by Lacretia Leigh ([id number]) on 01/16/2017 2:48:17 PM       Radiology Dg Chest 2 View  Result Date: 01/16/2017 CLINICAL DATA:  Onset of chest pain and shortness of breath 5 days ago with increasing symptoms. Nonsmoker. EXAM: CHEST  2 VIEW COMPARISON:  Chest x-ray of January 11, 2017 and April 02, 2010 FINDINGS: The lungs are adequately inflated. The interstitial markings are coarse. The heart and pulmonary vascularity are normal. The mediastinum is normal in width. The bony thorax exhibits no acute abnormality. IMPRESSION: There is no acute cardiopulmonary abnormality. Electronically Signed   By: David  Martinique M.D.   On: 01/16/2017 09:20    Procedures Procedures (including critical care time)  Medications Ordered  in ED Medications  gi cocktail (Maalox,Lidocaine,Donnatal) (30 mLs Oral Given 01/16/17 1314)  ketorolac (TORADOL) 15 MG/ML injection 15 mg (15 mg Intravenous Given 01/16/17 1315)     Initial Impression / Assessment and Plan / ED Course  I have reviewed the triage vital signs and the nursing notes.  Pertinent labs & imaging results that were available during my care of the patient were reviewed by me and considered in my medical decision making (see chart for details).     Pt presents to the Ed today with complaints of cp. Patient is to be discharged with recommendation to follow up with PCP in regards to today's hospital visit. Patient seen on 8/15 for same. Atypical chest pain patient states pain moves from left side to right side and worse with palpation. Seems to be  more musculoskeletal chest wall. Recommend anti-inflammatories and muscle relaxers. Negative workup at that time. Chest pain is not likely of cardiac or pulmonary etiology d/t presentation, perc negative, VSS, no tracheal deviation, no JVD or new murmur, RRR, breath sounds equal bilaterally, EKG without any change from prior tracing and shows no signs of ischemia, negative delta troponin, normal bnp, and negative CXR. Pt has been advised to return to the ED is CP becomes exertional, associated with diaphoresis or nausea, radiates to left jaw/arm, worsens or becomes concerning in any way. Pt appears reliable for follow up and is agreeable to discharge.      Final Clinical Impressions(s) / ED Diagnoses   Final diagnoses:  Chest wall pain    New Prescriptions New Prescriptions   CYCLOBENZAPRINE (FLEXERIL) 10 MG TABLET    Take 1 tablet (10 mg total) by mouth 2 (two) times daily as needed for muscle spasms.     Doristine Devoid, PA-C 01/16/17 1507    Lacretia Leigh, MD 01/17/17 [id number]

## 2017-02-15 ENCOUNTER — Emergency Department (HOSPITAL_BASED_OUTPATIENT_CLINIC_OR_DEPARTMENT_OTHER)
Admission: EM | Admit: 2017-02-15 | Discharge: 2017-02-15 | Disposition: A | Discharge status: Home / Self Care | Payer: BLUE CROSS/BLUE SHIELD | Attending: Emergency Medicine | Admitting: Emergency Medicine

## 2017-02-15 ENCOUNTER — Emergency Department (HOSPITAL_BASED_OUTPATIENT_CLINIC_OR_DEPARTMENT_OTHER): Payer: BLUE CROSS/BLUE SHIELD

## 2017-02-15 ENCOUNTER — Encounter (HOSPITAL_BASED_OUTPATIENT_CLINIC_OR_DEPARTMENT_OTHER): Payer: Self-pay | Admitting: Emergency Medicine

## 2017-02-15 DIAGNOSIS — X58XXXA Exposure to other specified factors, initial encounter: Secondary | ICD-10-CM | POA: Insufficient documentation

## 2017-02-15 DIAGNOSIS — Y9301 Activity, walking, marching and hiking: Secondary | ICD-10-CM | POA: Diagnosis not present

## 2017-02-15 DIAGNOSIS — S99912A Unspecified injury of left ankle, initial encounter: Secondary | ICD-10-CM | POA: Diagnosis present

## 2017-02-15 DIAGNOSIS — Y929 Unspecified place or not applicable: Secondary | ICD-10-CM | POA: Diagnosis not present

## 2017-02-15 DIAGNOSIS — Y999 Unspecified external cause status: Secondary | ICD-10-CM | POA: Diagnosis not present

## 2017-02-15 DIAGNOSIS — S93402A Sprain of unspecified ligament of left ankle, initial encounter: Secondary | ICD-10-CM | POA: Insufficient documentation

## 2017-02-15 NOTE — ED Triage Notes (Signed)
Pt reports LT ankle pain s/p stepping off curb this morning; sts he heard a pop; took ibuprofen 800mg  PTA

## 2017-02-15 NOTE — ED Provider Notes (Signed)
[facility] Provider Note   CSN: [id number] Arrival date & time: 02/15/17  0177     History   Chief Complaint Chief Complaint  Patient presents with  . Ankle Pain    HPI [name] is a 41 y.o. male who presents with left-sided ankle pain that began this morning approximately 7 AM. Patient reports that he stepped off a curb and had an inversion injury to his left ankle. He reports that he was able to get up and ambulate after the incident. He reports that he has been able to bear some weight but reports worsening pain with ambulation. He took 800 mg ibuprofen prior to ED arrival. Patient denies any numbness/weakness, redness or warmth.  The history is provided by the patient.    Past Medical History:  Diagnosis Date  . Eczema     Patient Active Problem List   Diagnosis Date Noted  . Low back pain radiating to left leg 01/13/2016  . Low HDL (under 40) 01/13/2015  . Prediabetes 01/13/2015  . Fatigue 08/13/2014  . Hypersomnia 08/13/2014  . Snoring 08/13/2014    Past Surgical History:  Procedure Laterality Date  . KNEE SURGERY     right  . WRIST FRACTURE SURGERY         Home Medications    Prior to Admission medications   Medication Sig Start Date End Date Taking? Authorizing Provider  cyclobenzaprine (FLEXERIL) 10 MG tablet Take 1 tablet (10 mg total) by mouth 2 (two) times daily as needed for muscle spasms. 01/16/17   Doristine Devoid, PA-C  naproxen sodium (ANAPROX) 220 MG tablet Take 440 mg by mouth daily as needed (for pain).    [provider]  nortriptyline (PAMELOR) 25 MG capsule Take 1 capsule (25 mg total) by mouth at bedtime. Patient not taking: Reported on 01/16/2017 01/25/16   Dene Gentry, MD    Family History No family history on file.  Social History Social History  Substance Use Topics  . Smoking status: Never Smoker  . Smokeless tobacco: Never Used  . Alcohol use No     Allergies   Patient has no known  allergies.   Review of Systems Review of Systems  Musculoskeletal:       Left ankle pain  Neurological: Negative for weakness and numbness.     Physical Exam Updated Vital Signs BP 139/81   Pulse 72   Temp 98.1 F (36.7 C) (Oral)   Resp 16   Ht 6\' 5"  (1.956 m)   Wt (!) 195 kg (430 lb)   SpO2 98%   BMI 50.99 kg/m   Physical Exam  Constitutional: He appears well-developed and well-nourished.  HENT:  Head: Normocephalic and atraumatic.  Eyes: EOM are normal.  Neck: Normal range of motion.  Cardiovascular:  Pulses:      Dorsalis pedis pulses are 2+ on the right side, and 2+ on the left side.  Pulmonary/Chest: Effort normal.  Musculoskeletal:  Tenderness to palpation to the medial malleolus of the left ankle.  No overlying ecchymosis, warmth, or erythema. No deformity or crepitus noted. Patient able to move all toes. Dorsiflexion and plantar flexion intact bilaterally.   Neurological:  Sensation intact throughout all major nerve distributions of the feet   Skin: Skin is warm and dry. Capillary refill takes less than 2 seconds.  The skin is intact to ankle/foot.  The foot is warm and well perfused with intact sensation  Nursing note and vitals reviewed.  ED Treatments / Results  Labs (all labs ordered are listed, but only abnormal results are displayed) Labs Reviewed - No data to display  EKG  EKG Interpretation None       Radiology Dg Ankle Complete Left  Result Date: 02/15/2017 CLINICAL DATA:  41 year old who injured the left ankle stepping off of a curb earlier today, audible pop at the time of the injury. Initial encounter. EXAM: LEFT ANKLE COMPLETE - 3+ VIEW COMPARISON:  06/24/2013, 11/01/2012. FINDINGS: Diffuse soft tissue swelling, medial greater than lateral, a chronic finding. No evidence of acute fracture. Ankle mortise intact with well-preserved joint space. Well-preserved bone mineral density. No visible joint effusion. Small plantar calcaneal spur.  IMPRESSION: No acute osseous abnormality. Electronically Signed   By: Evangeline Dakin M.D.   On: 02/15/2017 09:46    Procedures Procedures (including critical care time)  Medications Ordered in ED Medications - No data to display   Initial Impression / Assessment and Plan / ED Course  I have reviewed the triage vital signs and the nursing notes.  Pertinent labs & imaging results that were available during my care of the patient were reviewed by me and considered in my medical decision making (see chart for details).     41 yo M Presents with left ankle pain after mechanical fall this AM. Consistent with an ankle sprain/strain.  Vital signs reviewed and stable. Patient is neurovascularly intact. Consider sprain vs fracture vs dislocation.  XRs ordered.   XR reviewed. Negative for any acute fracture or dislocation. Explained results to patient and discussed that there could be a ligamentous or muscular injury that cannot be picked up on XR. Will plan to treat as a sprain. Conservative at-home therapies discussed. RICE protocol discussed. Patient reports that he cannot tolerate is a splint and crutches. He reports the last time he had an ASO splint, it cut off circulation to his foot. he is requesting a cam walker. Will provide ortho referral to be seen if there is no improvement in symptoms. Strict return precautions discussed. Patient expresses understanding and agreement to plan.    Final Clinical Impressions(s) / ED Diagnoses   Final diagnoses:  Sprain of left ankle, unspecified ligament, initial encounter    New Prescriptions New Prescriptions   No medications on file     Desma Mcgregor 02/15/17 1051    LongWonda Olds, MD 02/16/17 [phone]

## 2017-02-15 NOTE — Discharge Instructions (Signed)
Follow up with your Primary Care Doctor as needed.   Follow up with referred orthopedic doctor in 1-2 weeks if no improvement of pain.   You can take tylenol or ibuprofen as needed for pain. You can alternate Tylenol and Ibuprofen every 4 hours for additional pain relief.    Return to the Emergency Department immediately for any worsening pain, redness/swelling of the ankle, gray or blue color to the toes, numbness/weakness of toes or foot, difficulty walking or any other worsening or concerning symptoms.    Ankle sprain Ankle sprain occurs when the ligaments that hold the ankle joint to get her are stretched or torn. It may take 4-6 weeks to heal.  For activity: Use crutches with nonweightbearing for the first few days. Then, you may walk on your ankles as the pain allows, or as instructed. Start gradually with weight bearing on the affected ankle. Once you can walk pain free, then try jogging. When you can run forwards, then you can try moving side to side. If you cannot walk without crutches in one week, you need a recheck by your Family Doctor.  If you do not have a family doctor to followup with, you can see the list of phone numbers below. Please call today to make a followup appointment.   RICE therapy:  Routine Care for injuries  Rest, Ice, Compression, Elevation (RICE)  Rest is needed to allow your body to heal. Routine activities can be resumed when comfortable. Injury tendons and bones can take up to 6 weeks to heal. Tendons are cordlike structures that attach muscles and bones.  Ice following an injury helps keep the swelling down and reduce the pain. Put ice in a plastic bag. Place a towel between your skin and the bag of ice. Leave the ice on for 15-20 minutes, 3-4 times a day. Do this while awake, for the first 24-48 hours. After that continue as directed by your caregiver.  Compression helps keep swelling down. It also gives support and helps with discomfort. If any lasting  bandage has been applied, it should be removed and reapplied every 3-4 hours. It should not be applied tightly, but firmly enough to keep swelling down. Watch fingers or toes for swelling, discoloration, coldness, numbness or excessive pain. If any of these problems occur, removed the bandage and reapply loosely. Contact your caregiver if these problems continue.  Elevation helps reduce swelling and decrease your pain. With extremities such as the arms, hands, legs and feet, the injured area should be placed near or above the level of the heart if possible.

## 2017-02-15 NOTE — ED Notes (Signed)
Patient transported to X-ray 

## 2017-02-16 ENCOUNTER — Encounter: Payer: Self-pay | Admitting: Family Medicine

## 2017-02-16 ENCOUNTER — Ambulatory Visit (INDEPENDENT_AMBULATORY_CARE_PROVIDER_SITE_OTHER): Payer: BLUE CROSS/BLUE SHIELD | Admitting: Family Medicine

## 2017-02-16 ENCOUNTER — Ambulatory Visit (HOSPITAL_BASED_OUTPATIENT_CLINIC_OR_DEPARTMENT_OTHER)
Admission: RE | Admit: 2017-02-16 | Discharge: 2017-02-16 | Disposition: A | Discharge status: Home / Self Care | Payer: BLUE CROSS/BLUE SHIELD | Source: Ambulatory Visit | Attending: Family Medicine | Admitting: Family Medicine

## 2017-02-16 VITALS — BP 138/84 | HR 74 | Ht 77.0 in | Wt >= 6400 oz

## 2017-02-16 DIAGNOSIS — S8992XA Unspecified injury of left lower leg, initial encounter: Secondary | ICD-10-CM

## 2017-02-16 DIAGNOSIS — S99912A Unspecified injury of left ankle, initial encounter: Secondary | ICD-10-CM

## 2017-02-16 DIAGNOSIS — M79662 Pain in left lower leg: Secondary | ICD-10-CM | POA: Insufficient documentation

## 2017-02-16 NOTE — Patient Instructions (Signed)
You have a medial ankle sprain. Ice the area for 15 minutes at a time, 3-4 times a day Aleve 2 tabs twice a day with food OR ibuprofen 3 tabs three times a day with food for pain and inflammation. Elevate above the level of your heart when possible Crutches, knee scooter, cane, or walker if needed to help with walking Bear weight when tolerated Use Boot when up and walking around to help with stability while you recover from this injury. Come out of the boot twice a day to do Up/down and alphabet exercises 2-3 sets of each. Consider physical therapy for strengthening and balance exercises. If not improving as expected, we may repeat x-rays or consider further testing like an MRI. Follow up in 2 weeks.

## 2017-02-20 DIAGNOSIS — S99912A Unspecified injury of left ankle, initial encounter: Secondary | ICD-10-CM | POA: Insufficient documentation

## 2017-02-20 NOTE — Assessment & Plan Note (Signed)
Independently reviewed radiographs of ankle and tib/fibula.  No evidence fracture or unstable ankle.  Consistent with medial ankle sprain.  Icing.  Aleve or ibuprofen.  Elevation.  Cam walker when up and walking around.  We discussed knee scooter also.  Shown home motion exercises to do daily.  Consider repeat radiographs, physical therapy, MRI depending on his progress.  F/u in 2 weeks.

## 2017-02-20 NOTE — Progress Notes (Signed)
PCP: Secundino Ginger, PA-C  Subjective:   HPI: Patient is a 41 y.o. male here for left ankle injury.  Patient reports he was stepping off a curb on 9/19 shen he everted his left ankle and fell. Felt a pop when this happen along with tingling on bottom of his left foot/ Pain has been at 7/10 level, sharp and up to 10/10 with weight bearing. Has been icing and taking ibuprofen. No prior ankle injuries.  Past Medical History:  Diagnosis Date  . Eczema     Current Outpatient Prescriptions on File Prior to Visit  Medication Sig Dispense Refill  . cyclobenzaprine (FLEXERIL) 10 MG tablet Take 1 tablet (10 mg total) by mouth 2 (two) times daily as needed for muscle spasms. 10 tablet 0  . naproxen sodium (ANAPROX) 220 MG tablet Take 440 mg by mouth daily as needed (for pain).    . nortriptyline (PAMELOR) 25 MG capsule Take 1 capsule (25 mg total) by mouth at bedtime. (Patient not taking: Reported on 01/16/2017) 30 capsule 2   No current facility-administered medications on file prior to visit.     Past Surgical History:  Procedure Laterality Date  . KNEE SURGERY     right  . WRIST FRACTURE SURGERY      No Known Allergies  Social History   Social History  . Marital status: Married    Spouse name: N/A  . Number of children: N/A  . Years of education: N/A   Occupational History  . Not on file.   Social History Main Topics  . Smoking status: Never Smoker  . Smokeless tobacco: Never Used  . Alcohol use No  . Drug use: No  . Sexual activity: Not on file   Other Topics Concern  . Not on file   Social History Narrative  . No narrative on file    No family history on file.  BP 138/84   Pulse 74   Ht 6\' 5"  (1.956 m)   Wt (!) 440 lb (199.6 kg)   BMI 52.18 kg/m   Review of Systems: See HPI above.     Objective:  Physical Exam:  Gen: NAD, comfortable in exam room  Left ankle: Diffuse mod swelling.  No bruising, other deformity. Very limited motion all  directions. TTP over deltoid ligament, medial malleolus, anterior ankle joint.  Tenderness lateral calf and throughout fibula also. Negative ant drawer and talar tilt.  Pain and 1+ reverse talar tilt.   Mild pain with syndesmotic compression. Thompsons test negative. NV intact distally.  Right ankle: FROM without pain.   Assessment & Plan:  1. Left ankle injury - Independently reviewed radiographs of ankle and tib/fibula.  No evidence fracture or unstable ankle.  Consistent with medial ankle sprain.  Icing.  Aleve or ibuprofen.  Elevation.  Cam walker when up and walking around.  We discussed knee scooter also.  Shown home motion exercises to do daily.  Consider repeat radiographs, physical therapy, MRI depending on his progress.  F/u in 2 weeks.

## 2017-03-02 ENCOUNTER — Ambulatory Visit: Payer: BLUE CROSS/BLUE SHIELD | Admitting: Family Medicine

## 2017-03-07 DIAGNOSIS — Z9884 Bariatric surgery status: Secondary | ICD-10-CM | POA: Insufficient documentation

## 2017-03-12 ENCOUNTER — Emergency Department (HOSPITAL_BASED_OUTPATIENT_CLINIC_OR_DEPARTMENT_OTHER): Payer: BLUE CROSS/BLUE SHIELD

## 2017-03-12 ENCOUNTER — Encounter (HOSPITAL_BASED_OUTPATIENT_CLINIC_OR_DEPARTMENT_OTHER): Payer: Self-pay | Admitting: Emergency Medicine

## 2017-03-12 ENCOUNTER — Emergency Department (HOSPITAL_BASED_OUTPATIENT_CLINIC_OR_DEPARTMENT_OTHER)
Admission: EM | Admit: 2017-03-12 | Discharge: 2017-03-12 | Disposition: A | Discharge status: Home / Self Care | Payer: BLUE CROSS/BLUE SHIELD | Source: Home / Self Care | Attending: Emergency Medicine | Admitting: Emergency Medicine

## 2017-03-12 DIAGNOSIS — R0789 Other chest pain: Secondary | ICD-10-CM | POA: Insufficient documentation

## 2017-03-12 DIAGNOSIS — Z79899 Other long term (current) drug therapy: Secondary | ICD-10-CM | POA: Insufficient documentation

## 2017-03-12 DIAGNOSIS — R109 Unspecified abdominal pain: Secondary | ICD-10-CM

## 2017-03-12 DIAGNOSIS — I2699 Other pulmonary embolism without acute cor pulmonale: Secondary | ICD-10-CM | POA: Diagnosis not present

## 2017-03-12 DIAGNOSIS — R079 Chest pain, unspecified: Secondary | ICD-10-CM

## 2017-03-12 LAB — CBC WITH DIFFERENTIAL/PLATELET
Basophils Absolute: 0 10*3/uL (ref 0.0–0.1)
Basophils Relative: 0 %
Eosinophils Absolute: 0.1 10*3/uL (ref 0.0–0.7)
Eosinophils Relative: 1 %
HCT: 43.4 % (ref 39.0–52.0)
Hemoglobin: 14.1 g/dL (ref 13.0–17.0)
Lymphocytes Relative: 14 %
Lymphs Abs: 2 10*3/uL (ref 0.7–4.0)
MCH: 28.5 pg (ref 26.0–34.0)
MCHC: 32.5 g/dL (ref 30.0–36.0)
MCV: 87.7 fL (ref 78.0–100.0)
Monocytes Absolute: 1.4 10*3/uL — ABNORMAL HIGH (ref 0.1–1.0)
Monocytes Relative: 10 %
Neutro Abs: 11.2 10*3/uL — ABNORMAL HIGH (ref 1.7–7.7)
Neutrophils Relative %: 75 %
Platelets: 307 10*3/uL (ref 150–400)
RBC: 4.95 MIL/uL (ref 4.22–5.81)
RDW: 14.6 % (ref 11.5–15.5)
WBC: 14.7 10*3/uL — ABNORMAL HIGH (ref 4.0–10.5)

## 2017-03-12 LAB — COMPREHENSIVE METABOLIC PANEL
ALT: 85 U/L — ABNORMAL HIGH (ref 17–63)
AST: 45 U/L — ABNORMAL HIGH (ref 15–41)
Albumin: 3.7 g/dL (ref 3.5–5.0)
Alkaline Phosphatase: 76 U/L (ref 38–126)
Anion gap: 8 (ref 5–15)
BUN: 11 mg/dL (ref 6–20)
CO2: 22 mmol/L (ref 22–32)
Calcium: 8.8 mg/dL — ABNORMAL LOW (ref 8.9–10.3)
Chloride: 103 mmol/L (ref 101–111)
Creatinine, Ser: 1.11 mg/dL (ref 0.61–1.24)
GFR calc Af Amer: >60 mL/min (ref >60)
GFR calc non Af Amer: >60 mL/min (ref >60)
Glucose, Bld: 132 mg/dL — ABNORMAL HIGH (ref 65–99)
Potassium: 4.1 mmol/L (ref 3.5–5.1)
Sodium: 133 mmol/L — ABNORMAL LOW (ref 135–145)
Total Bilirubin: 2.1 mg/dL — ABNORMAL HIGH (ref 0.3–1.2)
Total Protein: 8.3 g/dL — ABNORMAL HIGH (ref 6.5–8.1)

## 2017-03-12 LAB — LIPASE, BLOOD: Lipase: 40 U/L (ref 11–51)

## 2017-03-12 LAB — TROPONIN I: Troponin I: <0.03 ng/mL (ref <0.03)

## 2017-03-12 MED ORDER — MORPHINE SULFATE (PF) 4 MG/ML IV SOLN
4.0000 mg | Freq: Once | INTRAVENOUS | Status: AC
  Administered 2017-03-12: 4 mg via INTRAVENOUS
  Filled 2017-03-12: qty 1

## 2017-03-12 MED ORDER — ONDANSETRON HCL 4 MG/2ML IJ SOLN
4.0000 mg | Freq: Once | INTRAMUSCULAR | Status: AC
  Administered 2017-03-12: 4 mg via INTRAVENOUS
  Filled 2017-03-12: qty 2

## 2017-03-12 NOTE — ED Provider Notes (Signed)
[facility] Provider Note   CSN: [id number] Arrival date & time: 03/12/17  2002     History   Chief Complaint Chief Complaint  Patient presents with  . Abdominal Pain    HPI [name] is a 41 y.o. male.   Abdominal Pain   Pertinent negatives include fever, diarrhea, nausea and vomiting.  patient had recent gastric sleeve surgery at [facility].done 6 days ago. He is now at home and was began to have pain in his right chest and maybe right upper abdomen. Goes to his back. Worse with breathing. Some shortness of breath. Worse with some movements. No swelling in his legs. States he took some pain medicine and some gas medicine earlier today and it helped somewhat. He did have a bowel movement yesterday. No fevers or chills. he weighs 508 pounds.he is still passing gas. No hemoptysis.no nausea or vomiting.  Past Medical History:  Diagnosis Date  . Eczema     Patient Active Problem List   Diagnosis Date Noted  . Left ankle injury, initial encounter 02/20/2017  . Low back pain radiating to left leg 01/13/2016  . Low HDL (under 40) 01/13/2015  . Prediabetes 01/13/2015  . Fatigue 08/13/2014  . Hypersomnia 08/13/2014  . Snoring 08/13/2014    Past Surgical History:  Procedure Laterality Date  . KNEE SURGERY     right  . LAPAROSCOPIC GASTRIC BAND REMOVAL WITH LAPAROSCOPIC GASTRIC SLEEVE RESECTION    . WRIST FRACTURE SURGERY         Home Medications    Prior to Admission medications   Medication Sig Start Date End Date Taking? Authorizing Provider  HYDROcodone-acetaminophen (NORCO/VICODIN) 5-325 MG tablet Take 1 tablet by mouth every 6 (six) hours as needed for moderate pain.   Yes [provider]  simethicone (MYLICON) 40 IW/5.8KD drops Take 40 mg by mouth 4 (four) times daily as needed for flatulence.   Yes [provider]  cyclobenzaprine (FLEXERIL) 10 MG tablet Take 1 tablet (10 mg total) by mouth 2 (two) times daily as needed for muscle  spasms. 01/16/17   Doristine Devoid, PA-C  naproxen sodium (ANAPROX) 220 MG tablet Take 440 mg by mouth daily as needed (for pain).    [provider]  nortriptyline (PAMELOR) 25 MG capsule Take 1 capsule (25 mg total) by mouth at bedtime. Patient not taking: Reported on 01/16/2017 01/25/16   Dene Gentry, MD    Family History History reviewed. No pertinent family history.  Social History Social History  Substance Use Topics  . Smoking status: Never Smoker  . Smokeless tobacco: Never Used  . Alcohol use No     Allergies   Patient has no known allergies.   Review of Systems Review of Systems  Constitutional: Negative for appetite change and fever.  HENT: Negative for congestion.   Respiratory: Positive for shortness of breath.   Cardiovascular: Positive for chest pain.  Gastrointestinal: Positive for abdominal pain. Negative for diarrhea, nausea and vomiting.  Genitourinary: Negative for flank pain.  Musculoskeletal: Positive for back pain.  Skin: Negative for rash.  Neurological: Negative for seizures.  Hematological: Negative for adenopathy.  Psychiatric/Behavioral: Negative for confusion.     Physical Exam Updated Vital Signs BP (!) 122/103   Pulse (!) 117   Temp 99.4 F (37.4 C) (Oral)   Resp (!) 24   Ht 6\' 5"  (1.956 m)   Wt (S) (!) 230.6 kg (508 lb 4.8 oz)   SpO2 97%   BMI 60.28  kg/m   Physical Exam  Constitutional: He appears well-developed.  Patient is morbidly obese  HENT:  Head: Normocephalic.  Neck: Neck supple.  Cardiovascular:  Mild tachycardia  Pulmonary/Chest: He exhibits tenderness.  Difficult exam due to body habitus. May have right lower chest wall and right upper quadrant tenderness.  Abdominal: There is tenderness.  Right upper quadrant tenderness. Abdominal wounds in mid abdomen with Dermabond are clean and intact.  Musculoskeletal: He exhibits no edema or tenderness.  Chronic venous changes in bilateral lower extremities   Neurological: He is alert.  Skin: Skin is warm.     ED Treatments / Results  Labs (all labs ordered are listed, but only abnormal results are displayed) Labs Reviewed  COMPREHENSIVE METABOLIC PANEL - Abnormal; Notable for the following:       Result Value   Sodium 133 (*)    Glucose, Bld 132 (*)    Calcium 8.8 (*)    Total Protein 8.3 (*)    AST 45 (*)    ALT 85 (*)    Total Bilirubin 2.1 (*)    All other components within normal limits  CBC WITH DIFFERENTIAL/PLATELET - Abnormal; Notable for the following:    WBC 14.7 (*)    Neutro Abs 11.2 (*)    Monocytes Absolute 1.4 (*)    All other components within normal limits  LIPASE, BLOOD  TROPONIN I    EKG  EKG Interpretation None       Radiology Dg Chest 2 View  Result Date: 03/12/2017 CLINICAL DATA:  Patient had recent gastric sleeve surgery. Reports that he is having pain from his right upper quadrant region up into his right chest. He reports that it is worse with movement and changing positions. EXAM: CHEST  2 VIEW COMPARISON:  01/24/2017 FINDINGS: Normal cardiac silhouette there is low lung volumes with bibasilar atelectasis. No focal consolidation. No pneumothorax IMPRESSION: Low lung volumes and bibasilar atelectasis versus infiltrate. Favor atelectasis Electronically Signed   By: Suzy Bouchard M.D.   On: 03/12/2017 21:49   Dg Abd 1 View  Result Date: 03/12/2017 CLINICAL DATA:  Patient had recent gastric sleeve surgery. Reports that he is having pain from his right upper quadrant region up into his right chest. He reports that it is worse with movement and changing positions. EXAM: ABDOMEN - 1 VIEW COMPARISON:  None. FINDINGS: No dilated large or small bowel.  No intraperitoneal free air. IMPRESSION: No evidence of bowel obstruction. Electronically Signed   By: Suzy Bouchard M.D.   On: 03/12/2017 21:50    Procedures Procedures (including critical care time)  Medications Ordered in ED Medications    morphine 4 MG/ML injection 4 mg (4 mg Intravenous Given 03/12/17 2217)  ondansetron (ZOFRAN) injection 4 mg (4 mg Intravenous Given 03/12/17 2217)     Initial Impression / Assessment and Plan / ED Course  I have reviewed the triage vital signs and the nursing notes.  Pertinent labs & imaging results that were available during my care of the patient were reviewed by me and considered in my medical decision making (see chart for details).     Patient with chest and abdominal pain post surgery. LFTs minimally elevated. However he is tachycardic and dyspneic post surgery. I think he is too high risk for a d-dimer but he will be difficult to get a CTA on. Discuss with radiology tech here and states he cannot get good imaging. Discussed with [facility]  thought they would potentially be able  to scan him depending on his shoulder diameter area however patient is not willing to be transferred or stay. States he will just go home. Aware of risks including death. Pulmonary embolism or potentially abdominal pathology is very high likelihood on him. However he appears to have the capacity to make this decision. States will go home although I attempted to convince him to go directly to [facility] if he is not willing to be transferred. Discharged AMA.  Final Clinical Impressions(s) / ED Diagnoses   Final diagnoses:  Abdominal pain  Chest pain, unspecified type  Abdominal pain, unspecified abdominal location    New Prescriptions New Prescriptions   No medications on file     Davonna Belling, MD 03/12/17 2324

## 2017-03-12 NOTE — ED Triage Notes (Addendum)
Patient had recent gastric sleeve surgery. Reports that he is having pain from his right upper quadrant region up into his right chest. He reports that it is worse with movement and changing positions. Patient states that it got better after the gas drops earlier this am  - then he started to have more pain about an hour ago

## 2017-03-12 NOTE — Discharge Instructions (Signed)
You're having chest pain and abdominal pain after surgery. This could be from something severe such as a pulmonary embolism. You're leaving against advice. Please go straight to [facility] emergency room to be seen for these problems.

## 2017-03-12 NOTE — ED Notes (Signed)
Back from xray, was unable to lie flat.

## 2017-03-12 NOTE — ED Notes (Signed)
Obese pt with recent gastric sleeve done on Monday at [facility] in [place]. Has been walking and using IS. Here for abd pain, R chest pain and back pain, also sob, (denies: fever, NVD, bleeding, dizziness, or constipation). Surgical wounds healing well, no signs of infection.  Alert, NAD, calm, interactive, resps shallow/guarded d/t increased pain with inspiration, speaking short phrases, skin W&D, VSS. Last BM yesterday soft/normal. Family at [facility].

## 2017-03-13 ENCOUNTER — Encounter (HOSPITAL_COMMUNITY): Payer: Self-pay

## 2017-03-13 ENCOUNTER — Emergency Department (HOSPITAL_COMMUNITY): Payer: BLUE CROSS/BLUE SHIELD

## 2017-03-13 ENCOUNTER — Observation Stay (HOSPITAL_BASED_OUTPATIENT_CLINIC_OR_DEPARTMENT_OTHER): Payer: BLUE CROSS/BLUE SHIELD

## 2017-03-13 ENCOUNTER — Inpatient Hospital Stay (HOSPITAL_COMMUNITY)
Admission: EM | Admit: 2017-03-13 | Discharge: 2017-03-15 | DRG: 175 | Disposition: A | Discharge status: Home / Self Care | Payer: BLUE CROSS/BLUE SHIELD | Attending: Internal Medicine | Admitting: Internal Medicine

## 2017-03-13 DIAGNOSIS — Z6841 Body Mass Index (BMI) 40.0 and over, adult: Secondary | ICD-10-CM

## 2017-03-13 DIAGNOSIS — D72829 Elevated white blood cell count, unspecified: Secondary | ICD-10-CM | POA: Diagnosis present

## 2017-03-13 DIAGNOSIS — J9601 Acute respiratory failure with hypoxia: Secondary | ICD-10-CM | POA: Diagnosis present

## 2017-03-13 DIAGNOSIS — F439 Reaction to severe stress, unspecified: Secondary | ICD-10-CM | POA: Diagnosis present

## 2017-03-13 DIAGNOSIS — R079 Chest pain, unspecified: Secondary | ICD-10-CM

## 2017-03-13 DIAGNOSIS — I2699 Other pulmonary embolism without acute cor pulmonale: Principal | ICD-10-CM | POA: Diagnosis present

## 2017-03-13 DIAGNOSIS — M5489 Other dorsalgia: Secondary | ICD-10-CM | POA: Diagnosis present

## 2017-03-13 DIAGNOSIS — R109 Unspecified abdominal pain: Secondary | ICD-10-CM | POA: Diagnosis present

## 2017-03-13 DIAGNOSIS — Z9884 Bariatric surgery status: Secondary | ICD-10-CM

## 2017-03-13 DIAGNOSIS — J9811 Atelectasis: Secondary | ICD-10-CM | POA: Diagnosis present

## 2017-03-13 DIAGNOSIS — R000 Tachycardia, unspecified: Secondary | ICD-10-CM | POA: Diagnosis present

## 2017-03-13 LAB — CBC WITH DIFFERENTIAL/PLATELET
Basophils Absolute: 0 10*3/uL (ref 0.0–0.1)
Basophils Relative: 0 %
Eosinophils Absolute: 0.1 10*3/uL (ref 0.0–0.7)
Eosinophils Relative: 1 %
HCT: 41.6 % (ref 39.0–52.0)
Hemoglobin: 13.6 g/dL (ref 13.0–17.0)
Lymphocytes Relative: 14 %
Lymphs Abs: 2.3 10*3/uL (ref 0.7–4.0)
MCH: 29 pg (ref 26.0–34.0)
MCHC: 32.7 g/dL (ref 30.0–36.0)
MCV: 88.7 fL (ref 78.0–100.0)
Monocytes Absolute: 1.3 10*3/uL — ABNORMAL HIGH (ref 0.1–1.0)
Monocytes Relative: 8 %
Neutro Abs: 12.4 10*3/uL — ABNORMAL HIGH (ref 1.7–7.7)
Neutrophils Relative %: 77 %
Platelets: 274 10*3/uL (ref 150–400)
RBC: 4.69 MIL/uL (ref 4.22–5.81)
RDW: 14.4 % (ref 11.5–15.5)
WBC: 16.1 10*3/uL — ABNORMAL HIGH (ref 4.0–10.5)

## 2017-03-13 LAB — COMPREHENSIVE METABOLIC PANEL
ALT: 71 U/L — ABNORMAL HIGH (ref 17–63)
AST: 32 U/L (ref 15–41)
Albumin: 3.3 g/dL — ABNORMAL LOW (ref 3.5–5.0)
Alkaline Phosphatase: 81 U/L (ref 38–126)
Anion gap: 11 (ref 5–15)
BUN: 7 mg/dL (ref 6–20)
CO2: 23 mmol/L (ref 22–32)
Calcium: 8.9 mg/dL (ref 8.9–10.3)
Chloride: 102 mmol/L (ref 101–111)
Creatinine, Ser: 1.05 mg/dL (ref 0.61–1.24)
GFR calc Af Amer: >60 mL/min (ref >60)
GFR calc non Af Amer: >60 mL/min (ref >60)
Glucose, Bld: 134 mg/dL — ABNORMAL HIGH (ref 65–99)
Potassium: 4.1 mmol/L (ref 3.5–5.1)
Sodium: 136 mmol/L (ref 135–145)
Total Bilirubin: 1.9 mg/dL — ABNORMAL HIGH (ref 0.3–1.2)
Total Protein: 8.2 g/dL — ABNORMAL HIGH (ref 6.5–8.1)

## 2017-03-13 LAB — ECHOCARDIOGRAM COMPLETE
Ao-asc: 32 cm
E decel time: 225 msec
FS: 33 % (ref 28–44)
Height: 77 in
IVS/LV PW RATIO, ED: 1.33
LA ID, A-P, ES: 39 mm
LA diam end sys: 39 mm
LA diam index: 1.07 cm/m2
LA vol A4C: 34.5 ml
LA vol index: 12.5 mL/m2
LA vol: 45.7 mL
LV PW d: 12 mm — AB (ref 0.6–1.1)
LV e' LATERAL: 9.68 cm/s
LVOT area: 3.14 cm2
LVOT diameter: 20 mm
Lateral S' vel: 15.8 cm/s
MV Dec: 225
MV pk E vel: 1.1 m/s
TAPSE: 28.1 mm
TDI e' lateral: 9.68
TDI e' medial: 10.7
Weight: 8080 oz

## 2017-03-13 LAB — HIV ANTIBODY (ROUTINE TESTING W REFLEX): HIV Screen 4th Generation wRfx: NONREACTIVE

## 2017-03-13 LAB — TROPONIN I
Troponin I: <0.03 ng/mL (ref <0.03)
Troponin I: <0.03 ng/mL (ref <0.03)
Troponin I: <0.03 ng/mL (ref <0.03)

## 2017-03-13 LAB — MRSA PCR SCREENING: MRSA by PCR: NEGATIVE

## 2017-03-13 LAB — HEPARIN LEVEL (UNFRACTIONATED): Heparin Unfractionated: 0.16 IU/mL — ABNORMAL LOW (ref 0.30–0.70)

## 2017-03-13 MED ORDER — HYDROCODONE-ACETAMINOPHEN 5-325 MG PO TABS: 1.0000 | ORAL_TABLET | Freq: Four times a day (QID) | ORAL | Status: DC | PRN

## 2017-03-13 MED ORDER — HYDROMORPHONE HCL 1 MG/ML IJ SOLN
1.0000 mg | Freq: Once | INTRAMUSCULAR | Status: AC
  Administered 2017-03-13: 1 mg via INTRAVENOUS
  Filled 2017-03-13: qty 1

## 2017-03-13 MED ORDER — HEPARIN (PORCINE) IN NACL 100-0.45 UNIT/ML-% IJ SOLN: 2400.0000 [IU]/h | INTRAMUSCULAR | Status: AC

## 2017-03-13 MED ORDER — ONDANSETRON HCL 4 MG/2ML IJ SOLN: 4.0000 mg | Freq: Four times a day (QID) | INTRAMUSCULAR | Status: DC | PRN

## 2017-03-13 MED ORDER — PREMIER PROTEIN SHAKE
11.0000 [oz_av] | Freq: Three times a day (TID) | ORAL | Status: DC
  Administered 2017-03-13 – 2017-03-15 (×4): 11 [oz_av] via ORAL
  Filled 2017-03-13 (×13): qty 325.31

## 2017-03-13 MED ORDER — CYCLOBENZAPRINE HCL 10 MG PO TABS: 10.0000 mg | ORAL_TABLET | Freq: Two times a day (BID) | ORAL | Status: DC | PRN

## 2017-03-13 MED ORDER — ENOXAPARIN SODIUM 120 MG/0.8ML ~~LOC~~ SOLN
240.0000 mg | Freq: Two times a day (BID) | SUBCUTANEOUS | Status: DC
  Filled 2017-03-13: qty 1.6

## 2017-03-13 MED ORDER — SIMETHICONE 80 MG PO CHEW
40.0000 mg | CHEWABLE_TABLET | Freq: Four times a day (QID) | ORAL | Status: DC | PRN
  Filled 2017-03-13: qty 1

## 2017-03-13 MED ORDER — ONDANSETRON HCL 4 MG/2ML IJ SOLN
4.0000 mg | Freq: Once | INTRAMUSCULAR | Status: AC
  Administered 2017-03-13: 4 mg via INTRAVENOUS
  Filled 2017-03-13: qty 2

## 2017-03-13 MED ORDER — WARFARIN SODIUM 5 MG PO TABS
15.0000 mg | ORAL_TABLET | Freq: Once | ORAL | Status: AC
  Administered 2017-03-13: 15 mg via ORAL
  Filled 2017-03-13: qty 3

## 2017-03-13 MED ORDER — HEPARIN (PORCINE) IN NACL 100-0.45 UNIT/ML-% IJ SOLN
2450.0000 [IU]/h | INTRAMUSCULAR | Status: DC
  Administered 2017-03-13: 2000 [IU]/h via INTRAVENOUS
  Filled 2017-03-13 (×2): qty 250

## 2017-03-13 MED ORDER — HYDROMORPHONE HCL 1 MG/ML IJ SOLN
1.0000 mg | INTRAMUSCULAR | Status: DC | PRN
  Administered 2017-03-13 – 2017-03-15 (×10): 1 mg via INTRAVENOUS
  Filled 2017-03-13 (×11): qty 1

## 2017-03-13 MED ORDER — HEPARIN BOLUS VIA INFUSION
2000.0000 [IU] | Freq: Once | INTRAVENOUS | Status: AC
  Administered 2017-03-13: 2000 [IU] via INTRAVENOUS
  Filled 2017-03-13: qty 2000

## 2017-03-13 MED ORDER — ACETAMINOPHEN 325 MG PO TABS: 650.0000 mg | ORAL_TABLET | Freq: Four times a day (QID) | ORAL | Status: DC | PRN

## 2017-03-13 MED ORDER — ACETAMINOPHEN 650 MG RE SUPP: 650.0000 mg | Freq: Four times a day (QID) | RECTAL | Status: DC | PRN

## 2017-03-13 MED ORDER — ONDANSETRON HCL 4 MG PO TABS: 4.0000 mg | ORAL_TABLET | Freq: Four times a day (QID) | ORAL | Status: DC | PRN

## 2017-03-13 MED ORDER — SODIUM CHLORIDE 0.9 % IV BOLUS (SEPSIS)
500.0000 mL | Freq: Once | INTRAVENOUS | Status: AC
  Administered 2017-03-13: 500 mL via INTRAVENOUS

## 2017-03-13 MED ORDER — ENOXAPARIN SODIUM 120 MG/0.8ML ~~LOC~~ SOLN
120.0000 mg | Freq: Two times a day (BID) | SUBCUTANEOUS | Status: DC
  Administered 2017-03-13 – 2017-03-15 (×4): 120 mg via SUBCUTANEOUS
  Filled 2017-03-13 (×6): qty 0.8

## 2017-03-13 MED ORDER — ENOXAPARIN SODIUM 120 MG/0.8ML ~~LOC~~ SOLN
120.0000 mg | Freq: Two times a day (BID) | SUBCUTANEOUS | Status: DC
  Administered 2017-03-13 – 2017-03-15 (×4): 120 mg via SUBCUTANEOUS
  Filled 2017-03-13 (×5): qty 0.8

## 2017-03-13 MED ORDER — HEPARIN BOLUS VIA INFUSION
2000.0000 [IU] | Freq: Once | INTRAVENOUS | Status: DC
  Filled 2017-03-13: qty 2000

## 2017-03-13 MED ORDER — ADULT MULTIVITAMIN LIQUID CH
15.0000 mL | Freq: Every day | ORAL | Status: DC
  Administered 2017-03-13 – 2017-03-15 (×3): 15 mL via ORAL
  Filled 2017-03-13 (×3): qty 15

## 2017-03-13 MED ORDER — HYDROCODONE-ACETAMINOPHEN 7.5-325 MG/15ML PO SOLN
10.0000 mL | ORAL | Status: DC | PRN
  Administered 2017-03-13 – 2017-03-14 (×2): 10 mL via ORAL
  Filled 2017-03-13 (×2): qty 15

## 2017-03-13 MED ORDER — WARFARIN - PHARMACIST DOSING INPATIENT
Freq: Every day | Status: DC
  Administered 2017-03-13 – 2017-03-14 (×2)

## 2017-03-13 MED ORDER — IOPAMIDOL (ISOVUE-370) INJECTION 76%
INTRAVENOUS | Status: AC
  Administered 2017-03-13: 100 mL
  Filled 2017-03-13: qty 100

## 2017-03-13 NOTE — Progress Notes (Signed)
ANTICOAGULATION CONSULT NOTE - Initial Consult  Pharmacy Consult for Heparin  Indication: pulmonary embolus  No Known Allergies  Patient Measurements: Height: 6\' 1"  (185.4 cm) Weight: (!) 508 lb (230.4 kg) IBW/kg (Calculated) : 79.9  Vital Signs: Temp: 99.2 F (37.3 C) (10/15 0323) Temp Source: Oral (10/15 0323) BP: 115/69 (10/15 0323) Pulse Rate: 99 (10/15 0323)  Labs:  Recent Labs  03/12/17 2200  HGB 14.1  HCT 43.4  PLT 307  CREATININE 1.11  TROPONINI <0.03    Estimated Creatinine Clearance: 175.3 mL/min (by C-G formula based on SCr of 1.11 mg/dL).   Medical History: Past Medical History:  Diagnosis Date  . Eczema     Assessment: 41 y/o M presents to ED with right-sided CP. Pt just had gastric sleeve surgery on 03/07/17. Technically limited CT scan showing evidence of PE with no heart strain. CBC/renal function good.   Goal of Therapy:  Heparin level 0.3-0.7 units/ml Monitor platelets by anticoagulation protocol: Yes   Plan:  -Heparin 2000 units BOLUS (reducing bolus by 50-75% due to recent laparoscopic surgery) -Start heparin drip 2000 units/hr -1300 HL -Daily CBC/HL -Monitor for bleeding  Narda Bonds 03/13/2017,4:23 AM

## 2017-03-13 NOTE — Progress Notes (Signed)
  Echocardiogram 2D Echocardiogram has been performed.  [name] 03/13/2017, 3:33 PM

## 2017-03-13 NOTE — Progress Notes (Addendum)
ANTICOAGULATION CONSULT NOTE - Initial Consult  Pharmacy Consult for Heparin  Indication: pulmonary embolus  No Known Allergies  Patient Measurements: Height: 6\' 5"  (195.6 cm) Weight: (!) 505 lb (229.1 kg) IBW/kg (Calculated) : 89.1  Heparin dosing weight: 146 kg  Assessment: 41 y/o M presents to ED with right-sided CP. Pt just had gastric sleeve surgery on 03/07/17. Technically limited CT scan showing evidence of PE with no heart strain. CBC/renal function good.   First heparin level is subtherapeutic at 0.16.   Will plan to get antiXa level before discharge. Will need to inject two Lovenox syringes of 120mg  Q12h for bridge.  Goal of Therapy:  AntiXa 0.6-1.0 units/mL Monitor platelets by anticoagulation protocol: Yes   Plan:  Stop heparin gtt Give Coumadin 15mg  PO x 1 Start enoxaparin 240mg  [place] Q12h (Separate into two 120mg  injections) Monitor antiXa, CBC, s/s of bleed  Will plan to check AntiXa level tomorrow  Elenor Quinones, PharmD, BCPS Clinical Pharmacist Pager [phone] 03/13/2017 1:58 PM

## 2017-03-13 NOTE — Care Management Note (Signed)
Case Management Note  Patient Details  Name: [name] MRN: [id number] Date of Birth: [birth year]  Subjective/Objective:  Pt admitted on 03/13/17 with c/o RT sided CP and SOB; CT shows RT-sided pulmonary embolism.  PTA, pt independent, lives with spouse.  Hx of recent gastric sleeve resection in [place] last week.                    Action/Plan: Pt on heparin drip; will follow for discharge needs as pt progresses.    Expected Discharge Date:                  Expected Discharge Plan:  Home/Self Care  In-House Referral:     Discharge planning Services  CM Consult  Post Acute Care Choice:    Choice offered to:     DME Arranged:    DME Agency:     HH Arranged:    HH Agency:     Status of Service:  In process, will continue to follow  If discussed at Long Length of Stay Meetings, dates discussed:    Additional Comments:  Ella Bodo, RN 03/13/2017, 10:52 AM

## 2017-03-13 NOTE — ED Provider Notes (Addendum)
[facility] DEPT Provider Note   CSN: [id number] Arrival date & time: 03/13/17  0258     History   Chief Complaint Chief Complaint  Patient presents with  . Post-op Problem    HPI [name] is a 41 y.o. male.  Patient presents to the emergency department for evaluation of right-sided chest and abdominal pain. Patient had gastric sleeve surgery performed in [place] 6 days ago. He has noticed pain on the right side of his upper abdomen and into the chest and back area, worsens when he lies down and causes him to feel short of breath. Pain worsens when he takes a deep breath. He was evaluated at [facility] earlier today, but CT scan was unavailable due to his size, he left AGAINST MEDICAL ADVICE. Patient then went to [facility] to be evaluated, was unable to be scanned because of his size. Patient was transferred herefor further evaluation.      Past Medical History:  Diagnosis Date  . Eczema     Patient Active Problem List   Diagnosis Date Noted  . Left ankle injury, initial encounter 02/20/2017  . Low back pain radiating to left leg 01/13/2016  . Low HDL (under 40) 01/13/2015  . Prediabetes 01/13/2015  . Fatigue 08/13/2014  . Hypersomnia 08/13/2014  . Snoring 08/13/2014    Past Surgical History:  Procedure Laterality Date  . KNEE SURGERY     right  . LAPAROSCOPIC GASTRIC BAND REMOVAL WITH LAPAROSCOPIC GASTRIC SLEEVE RESECTION    . WRIST FRACTURE SURGERY         Home Medications    Prior to Admission medications   Medication Sig Start Date End Date Taking? Authorizing Provider  cyclobenzaprine (FLEXERIL) 10 MG tablet Take 1 tablet (10 mg total) by mouth 2 (two) times daily as needed for muscle spasms. 01/16/17   Doristine Devoid, PA-C  HYDROcodone-acetaminophen (NORCO/VICODIN) 5-325 MG tablet Take 1 tablet by mouth every 6 (six) hours as needed for moderate pain.    [provider]  naproxen sodium (ANAPROX) 220 MG tablet  Take 440 mg by mouth daily as needed (for pain).    [provider]  nortriptyline (PAMELOR) 25 MG capsule Take 1 capsule (25 mg total) by mouth at bedtime. Patient not taking: Reported on 01/16/2017 01/25/16   Dene Gentry, MD  simethicone (MYLICON) 40 DG/6.4QI drops Take 40 mg by mouth 4 (four) times daily as needed for flatulence.    [provider]    Family History No family history on file.  Social History Social History  Substance Use Topics  . Smoking status: Never Smoker  . Smokeless tobacco: Never Used  . Alcohol use No     Allergies   Patient has no known allergies.   Review of Systems Review of Systems  Respiratory: Positive for shortness of breath.   Cardiovascular: Positive for chest pain.  Gastrointestinal: Positive for abdominal pain.  All other systems reviewed and are negative.    Physical Exam Updated Vital Signs BP 115/69   Pulse 99   Temp 99.2 F (37.3 C) (Oral)   Resp 16   Ht 6\' 1"  (1.854 m)   Wt (!) 230.4 kg (508 lb)   SpO2 95%   BMI 67.02 kg/m   Physical Exam  Constitutional: He is oriented to person, place, and time. He appears well-developed and well-nourished. No distress.  HENT:  Head: Normocephalic and atraumatic.  Right Ear: Hearing normal.  Left Ear: Hearing normal.  Nose: Nose  normal.  Mouth/Throat: Oropharynx is clear and moist and mucous membranes are normal.  Eyes: Pupils are equal, round, and reactive to light. Conjunctivae and EOM are normal.  Neck: Normal range of motion. Neck supple.  Cardiovascular: Regular rhythm, S1 normal and S2 normal.  Exam reveals no gallop and no friction rub.   No murmur heard. Pulmonary/Chest: Effort normal and breath sounds normal. No respiratory distress. He exhibits tenderness.    Abdominal: Soft. Normal appearance and bowel sounds are normal. There is no hepatosplenomegaly. There is tenderness in the right upper quadrant. There is no rebound, no guarding, no tenderness  at McBurney's point and negative Murphy's sign. No hernia.  Musculoskeletal: Normal range of motion.  Neurological: He is alert and oriented to person, place, and time. He has normal strength. No cranial nerve deficit or sensory deficit. Coordination normal. GCS eye subscore is 4. GCS verbal subscore is 5. GCS motor subscore is 6.  Skin: Skin is warm, dry and intact. No rash noted. No cyanosis.  Psychiatric: He has a normal mood and affect. His speech is normal and behavior is normal. Thought content normal.  Nursing note and vitals reviewed.    ED Treatments / Results  Labs (all labs ordered are listed, but only abnormal results are displayed) Labs Reviewed - No data to display  EKG  EKG Interpretation None       Radiology Dg Chest 2 View  Result Date: 03/12/2017 CLINICAL DATA:  Patient had recent gastric sleeve surgery. Reports that he is having pain from his right upper quadrant region up into his right chest. He reports that it is worse with movement and changing positions. EXAM: CHEST  2 VIEW COMPARISON:  01/24/2017 FINDINGS: Normal cardiac silhouette there is low lung volumes with bibasilar atelectasis. No focal consolidation. No pneumothorax IMPRESSION: Low lung volumes and bibasilar atelectasis versus infiltrate. Favor atelectasis Electronically Signed   By: Suzy Bouchard M.D.   On: 03/12/2017 21:49   Dg Abd 1 View  Result Date: 03/12/2017 CLINICAL DATA:  Patient had recent gastric sleeve surgery. Reports that he is having pain from his right upper quadrant region up into his right chest. He reports that it is worse with movement and changing positions. EXAM: ABDOMEN - 1 VIEW COMPARISON:  None. FINDINGS: No dilated large or small bowel.  No intraperitoneal free air. IMPRESSION: No evidence of bowel obstruction. Electronically Signed   By: Suzy Bouchard M.D.   On: 03/12/2017 21:50   Ct Angio Chest Pe W Or Wo Contrast  Result Date: 03/13/2017 CLINICAL DATA:  Abdominal  pain. Recent gastric sleeve placement. Right upper quadrant and chest pain with shortness of breath. EXAM: CT ANGIOGRAPHY CHEST CT ABDOMEN AND PELVIS WITH CONTRAST TECHNIQUE: Multidetector CT imaging of the chest was performed using the standard protocol during bolus administration of intravenous contrast. Multiplanar CT image reconstructions and MIPs were obtained to evaluate the vascular anatomy. Multidetector CT imaging of the abdomen and pelvis was performed using the standard protocol during bolus administration of intravenous contrast. CONTRAST:  100 mL Isovue 370 COMPARISON:  None. FINDINGS: CTA CHEST FINDINGS Cardiovascular: Poor contrast bolus limits evaluation of the pulmonary arteries but there is evidence of a large filling defect in the distal right main pulmonary artery, extending into the right lower lobe branches. This is consistent with acute pulmonary embolus. Normal heart size. RA to RV ratio is 0.61, suggesting no evidence of right heart strain. No pericardial effusion. Normal caliber thoracic aorta. Mediastinum/Nodes: Scattered mediastinal lymph nodes are not  pathologically enlarged and are likely reactive. The esophagus is decompressed. No abnormal mediastinal fluid collections. Thyroid gland is normal sized and homogeneous. Lungs/Pleura: Focal areas of atelectasis or linear consolidation in both lower lungs, greater on the right. No pleural effusions. No pneumothorax. Airways are patent. Musculoskeletal: Degenerative changes in the spine. No destructive bone lesions. Review of the MIP images confirms the above findings. CT ABDOMEN and PELVIS FINDINGS Hepatobiliary: Diffuse fatty infiltration of the liver. Gallbladder and bile ducts are unremarkable. Pancreas: Unremarkable. No pancreatic ductal dilatation or surrounding inflammatory changes. Spleen: Normal in size without focal abnormality. Adrenals/Urinary Tract: Adrenal glands are unremarkable. Kidneys are normal, without renal calculi, focal  lesion, or hydronephrosis. Bladder is unremarkable. Stomach/Bowel: Postoperative changes consistent with gastric sleeve procedure. Stomach is decompressed. Small bowel are decompressed. Scattered gas and stool in the colon without abnormal distention or inflammatory change. Appendix is normal. Vascular/Lymphatic: No significant vascular findings are present. No enlarged abdominal or pelvic lymph nodes. Reproductive: Prostate is unremarkable. Other: No free air or free fluid in the abdomen. Small periumbilical hernia containing fat. No abnormal abdominal or retroperitoneal fluid collections. Musculoskeletal: Degenerative changes in the spine. No destructive bone lesions. Review of the MIP images confirms the above findings. IMPRESSION: 1. Technically limited examination up the pulmonary arteries due to poor contrast bolus. There is evidence of acute pulmonary embolus on the right. No evidence of right heart strain. 2. Atelectasis or linear consolidation in both lower lungs. No effusions. 3. Postoperative changes consistent with gastric sleeve procedure. No evidence of bowel obstruction or perforation. 4. Diffuse fatty infiltration of the liver. These results were called by telephone at the time of interpretation on 03/13/2017 at 4:14 am to Dr. Joseph Berkshire , who verbally acknowledged these results. Electronically Signed   By: Lucienne Capers M.D.   On: 03/13/2017 04:16   Ct Abdomen Pelvis W Contrast  Result Date: 03/13/2017 CLINICAL DATA:  Abdominal pain. Recent gastric sleeve placement. Right upper quadrant and chest pain with shortness of breath. EXAM: CT ANGIOGRAPHY CHEST CT ABDOMEN AND PELVIS WITH CONTRAST TECHNIQUE: Multidetector CT imaging of the chest was performed using the standard protocol during bolus administration of intravenous contrast. Multiplanar CT image reconstructions and MIPs were obtained to evaluate the vascular anatomy. Multidetector CT imaging of the abdomen and pelvis was  performed using the standard protocol during bolus administration of intravenous contrast. CONTRAST:  100 mL Isovue 370 COMPARISON:  None. FINDINGS: CTA CHEST FINDINGS Cardiovascular: Poor contrast bolus limits evaluation of the pulmonary arteries but there is evidence of a large filling defect in the distal right main pulmonary artery, extending into the right lower lobe branches. This is consistent with acute pulmonary embolus. Normal heart size. RA to RV ratio is 0.61, suggesting no evidence of right heart strain. No pericardial effusion. Normal caliber thoracic aorta. Mediastinum/Nodes: Scattered mediastinal lymph nodes are not pathologically enlarged and are likely reactive. The esophagus is decompressed. No abnormal mediastinal fluid collections. Thyroid gland is normal sized and homogeneous. Lungs/Pleura: Focal areas of atelectasis or linear consolidation in both lower lungs, greater on the right. No pleural effusions. No pneumothorax. Airways are patent. Musculoskeletal: Degenerative changes in the spine. No destructive bone lesions. Review of the MIP images confirms the above findings. CT ABDOMEN and PELVIS FINDINGS Hepatobiliary: Diffuse fatty infiltration of the liver. Gallbladder and bile ducts are unremarkable. Pancreas: Unremarkable. No pancreatic ductal dilatation or surrounding inflammatory changes. Spleen: Normal in size without focal abnormality. Adrenals/Urinary Tract: Adrenal glands are unremarkable. Kidneys are normal, without renal  calculi, focal lesion, or hydronephrosis. Bladder is unremarkable. Stomach/Bowel: Postoperative changes consistent with gastric sleeve procedure. Stomach is decompressed. Small bowel are decompressed. Scattered gas and stool in the colon without abnormal distention or inflammatory change. Appendix is normal. Vascular/Lymphatic: No significant vascular findings are present. No enlarged abdominal or pelvic lymph nodes. Reproductive: Prostate is unremarkable. Other: No  free air or free fluid in the abdomen. Small periumbilical hernia containing fat. No abnormal abdominal or retroperitoneal fluid collections. Musculoskeletal: Degenerative changes in the spine. No destructive bone lesions. Review of the MIP images confirms the above findings. IMPRESSION: 1. Technically limited examination up the pulmonary arteries due to poor contrast bolus. There is evidence of acute pulmonary embolus on the right. No evidence of right heart strain. 2. Atelectasis or linear consolidation in both lower lungs. No effusions. 3. Postoperative changes consistent with gastric sleeve procedure. No evidence of bowel obstruction or perforation. 4. Diffuse fatty infiltration of the liver. These results were called by telephone at the time of interpretation on 03/13/2017 at 4:14 am to Dr. Joseph Berkshire , who verbally acknowledged these results. Electronically Signed   By: Lucienne Capers M.D.   On: 03/13/2017 04:16    Procedures Procedures (including critical care time)  Medications Ordered in ED Medications  HYDROmorphone (DILAUDID) injection 1 mg (not administered)  sodium chloride 0.9 % bolus 500 mL (0 mLs Intravenous Stopped 03/13/17 0412)  HYDROmorphone (DILAUDID) injection 1 mg (1 mg Intravenous Given 03/13/17 0325)  ondansetron (ZOFRAN) injection 4 mg (4 mg Intravenous Given 03/13/17 0325)  iopamidol (ISOVUE-370) 76 % injection (100 mLs  Contrast Given 03/13/17 0346)     Initial Impression / Assessment and Plan / ED Course  I have reviewed the triage vital signs and the nursing notes.  Pertinent labs & imaging results that were available during my care of the patient were reviewed by me and considered in my medical decision making (see chart for details).     Patient presents to the ER as a transfer from [facility] for evaluation of right-sided abdominal and chest pain. Patient noted to be tachycardic but not hypoxic. He is at high risk for PE secondary to  recent gastric lap band surgery. He did, however, have some right-sided abdominal tenderness. Low concern for lap band procedure competition, as he is not having left sided abdominal pain or tenderness. Patient underwent CT angiography chest, CT abdomen and pelvis. No acute abdominal pathology was noted but he does have large right-sided PE. Patient initiated on heparin. No right heart strain.  CRITICAL CARE Performed by: Orpah Greek   Total critical care time: 30 minutes  Critical care time was exclusive of separately billable procedures and treating other patients.  Critical care was necessary to treat or prevent imminent or life-threatening deterioration.  Critical care was time spent personally by me on the following activities: development of treatment plan with patient and/or surrogate as well as nursing, discussions with consultants, evaluation of patient's response to treatment, examination of patient, obtaining history from patient or surrogate, ordering and performing treatments and interventions, ordering and review of laboratory studies, ordering and review of radiographic studies, pulse oximetry and re-evaluation of patient's condition.   Final Clinical Impressions(s) / ED Diagnoses   Final diagnoses:  Other acute pulmonary embolism without acute cor pulmonale (HCC)    New Prescriptions New Prescriptions   No medications on file     Orpah Greek, MD 03/13/17 0421    Orpah Greek, MD 03/13/17 [phone]

## 2017-03-13 NOTE — Plan of Care (Signed)
Problem: Pain Managment: Goal: General experience of comfort will improve Outcome: Progressing Pain medication given. Pain/comfort level improved.

## 2017-03-13 NOTE — ED Notes (Signed)
Hal Hope MD at bedside, plan to change bed to SDU

## 2017-03-13 NOTE — ED Triage Notes (Signed)
Pt was transfer from [facility] for CT scan. Recently had gastric sleeve placement, pain in RUQ and chest along with SOB.

## 2017-03-13 NOTE — H&P (Signed)
History and Physical    [name] GUR:[id number] DOB: 1976-05-18 DOA: 03/13/2017  PCP: Secundino Ginger, PA-C  Patient coming from:  Home.  Chief Complaint: chest pain and shortness of breath.  HPI: [name] is a 41 y.o. male with history of morbid obesity who has undergone recent gastric sleeve resection last week in [place] presents to the ER with complains of right-sided chest pain and shortness of breath. Patient's symptoms started off yesterday morning. Pain is pleuritic in nature. Present even at rest. Patient has noticed increasing swelling in his lower extremities last few days. Patient had gone to the urgent  Care center and was referred to the ER.   ED Course: CT angiogram of the chest and abdomen was done. Shows right-sided pulmonary embolism. Also shows postoperative changes for the gastric sleeve surgery. Patient remains hemodynamically  And was started on IV heparin.  Review of Systems: As per HPI, rest all negative.   Past Medical History:  Diagnosis Date  . Eczema     Past Surgical History:  Procedure Laterality Date  . KNEE SURGERY     right  . LAPAROSCOPIC GASTRIC BAND REMOVAL WITH LAPAROSCOPIC GASTRIC SLEEVE RESECTION    . WRIST FRACTURE SURGERY       reports that he has never smoked. He has never used smokeless tobacco. He reports that he does not drink alcohol or use drugs.  No Known Allergies  Family History  Problem Relation Age of Onset  . Hypertension Other     Prior to Admission medications   Medication Sig Start Date End Date Taking? Authorizing Provider  cyclobenzaprine (FLEXERIL) 10 MG tablet Take 1 tablet (10 mg total) by mouth 2 (two) times daily as needed for muscle spasms. 01/16/17   Doristine Devoid, PA-C  HYDROcodone-acetaminophen (NORCO/VICODIN) 5-325 MG tablet Take 1 tablet by mouth every 6 (six) hours as needed for moderate pain.    [provider]  naproxen sodium (ANAPROX) 220 MG tablet Take 440 mg by mouth  daily as needed (for pain).    [provider]  nortriptyline (PAMELOR) 25 MG capsule Take 1 capsule (25 mg total) by mouth at bedtime. Patient not taking: Reported on 01/16/2017 01/25/16   Dene Gentry, MD  simethicone (MYLICON) 40 EG/3.1DV drops Take 40 mg by mouth 4 (four) times daily as needed for flatulence.    [provider]    Physical Exam: Vitals:   03/13/17 0314 03/13/17 0323  BP:  115/69  Pulse:  99  Resp:  16  Temp:  99.2 F (37.3 C)  TempSrc:  Oral  SpO2:  95%  Weight: (!) 230.4 kg (508 lb)   Height: 6\' 1"  (1.854 m)       Constitutional: moderately built and nourished. Vitals:   03/13/17 0314 03/13/17 0323  BP:  115/69  Pulse:  99  Resp:  16  Temp:  99.2 F (37.3 C)  TempSrc:  Oral  SpO2:  95%  Weight: (!) 230.4 kg (508 lb)   Height: 6\' 1"  (1.854 m)    Eyes: anicteric no pallor. ENMT: no discharge from the ears eyes nose or more. Neck: no JVD appreciated no mass felt Respiratory: no rhonchi or crepitations. Cardiovascular: S1-S2 heard no murmurs appreciat Abdomen: soft nontender bowel sounds present. Musculoskeletal: mild edema with both lower extremities. Skin: no rash. Skin appears warm. Neurologic:alert awake oriented to time place and person. Moves all extremities. Psychiatric: appears normal. Normal affect.   Labs on Admission: I have personally  reviewed following labs and imaging studies  CBC:  Recent Labs Lab 03/12/17 2200  WBC 14.7*  NEUTROABS 11.2*  HGB 14.1  HCT 43.4  MCV 87.7  PLT 341   Basic Metabolic Panel:  Recent Labs Lab 03/12/17 2200  NA 133*  K 4.1  CL 103  CO2 22  GLUCOSE 132*  BUN 11  CREATININE 1.11  CALCIUM 8.8*   GFR: Estimated Creatinine Clearance: 175.3 mL/min (by C-G formula based on SCr of 1.11 mg/dL). Liver Function Tests:  Recent Labs Lab 03/12/17 2200  AST 45*  ALT 85*  ALKPHOS 76  BILITOT 2.1*  PROT 8.3*  ALBUMIN 3.7    Recent Labs Lab 03/12/17 2200  LIPASE 40     No results for input(s): AMMONIA in the last 168 hours. Coagulation Profile: No results for input(s): INR, PROTIME in the last 168 hours. Cardiac Enzymes:  Recent Labs Lab 03/12/17 2200  TROPONINI <0.03   BNP (last 3 results) No results for input(s): PROBNP in the last 8760 hours. HbA1C: No results for input(s): HGBA1C in the last 72 hours. CBG: No results for input(s): GLUCAP in the last 168 hours. Lipid Profile: No results for input(s): CHOL, HDL, LDLCALC, TRIG, CHOLHDL, LDLDIRECT in the last 72 hours. Thyroid Function Tests: No results for input(s): TSH, T4TOTAL, FREET4, T3FREE, THYROIDAB in the last 72 hours. Anemia Panel: No results for input(s): VITAMINB12, FOLATE, FERRITIN, TIBC, IRON, RETICCTPCT in the last 72 hours. Urine analysis:    Component Value Date/Time   COLORURINE YELLOW 09/01/2008 0109   APPEARANCEUR CLOUDY (A) 09/01/2008 0109   LABSPEC 1.028 09/01/2008 0109   PHURINE 6.0 09/01/2008 0109   GLUCOSEU NEGATIVE 09/01/2008 0109   HGBUR NEGATIVE 09/01/2008 0109   BILIRUBINUR NEGATIVE 09/01/2008 0109   KETONESUR NEGATIVE 09/01/2008 0109   PROTEINUR NEGATIVE 09/01/2008 0109   UROBILINOGEN 1.0 09/01/2008 0109   NITRITE NEGATIVE 09/01/2008 0109   LEUKOCYTESUR SMALL (A) 09/01/2008 0109   Sepsis Labs: @LABRCNTIP (procalcitonin:4,lacticidven:4) )No results found for this or any previous visit (from the past 240 hour(s)).   Radiological Exams on Admission: Dg Chest 2 View  Result Date: 03/12/2017 CLINICAL DATA:  Patient had recent gastric sleeve surgery. Reports that he is having pain from his right upper quadrant region up into his right chest. He reports that it is worse with movement and changing positions. EXAM: CHEST  2 VIEW COMPARISON:  01/24/2017 FINDINGS: Normal cardiac silhouette there is low lung volumes with bibasilar atelectasis. No focal consolidation. No pneumothorax IMPRESSION: Low lung volumes and bibasilar atelectasis versus infiltrate. Favor  atelectasis Electronically Signed   By: Suzy Bouchard M.D.   On: 03/12/2017 21:49   Dg Abd 1 View  Result Date: 03/12/2017 CLINICAL DATA:  Patient had recent gastric sleeve surgery. Reports that he is having pain from his right upper quadrant region up into his right chest. He reports that it is worse with movement and changing positions. EXAM: ABDOMEN - 1 VIEW COMPARISON:  None. FINDINGS: No dilated large or small bowel.  No intraperitoneal free air. IMPRESSION: No evidence of bowel obstruction. Electronically Signed   By: Suzy Bouchard M.D.   On: 03/12/2017 21:50   Ct Angio Chest Pe W Or Wo Contrast  Result Date: 03/13/2017 CLINICAL DATA:  Abdominal pain. Recent gastric sleeve placement. Right upper quadrant and chest pain with shortness of breath. EXAM: CT ANGIOGRAPHY CHEST CT ABDOMEN AND PELVIS WITH CONTRAST TECHNIQUE: Multidetector CT imaging of the chest was performed using the standard protocol during bolus administration of intravenous  contrast. Multiplanar CT image reconstructions and MIPs were obtained to evaluate the vascular anatomy. Multidetector CT imaging of the abdomen and pelvis was performed using the standard protocol during bolus administration of intravenous contrast. CONTRAST:  100 mL Isovue 370 COMPARISON:  None. FINDINGS: CTA CHEST FINDINGS Cardiovascular: Poor contrast bolus limits evaluation of the pulmonary arteries but there is evidence of a large filling defect in the distal right main pulmonary artery, extending into the right lower lobe branches. This is consistent with acute pulmonary embolus. Normal heart size. RA to RV ratio is 0.61, suggesting no evidence of right heart strain. No pericardial effusion. Normal caliber thoracic aorta. Mediastinum/Nodes: Scattered mediastinal lymph nodes are not pathologically enlarged and are likely reactive. The esophagus is decompressed. No abnormal mediastinal fluid collections. Thyroid gland is normal sized and homogeneous.  Lungs/Pleura: Focal areas of atelectasis or linear consolidation in both lower lungs, greater on the right. No pleural effusions. No pneumothorax. Airways are patent. Musculoskeletal: Degenerative changes in the spine. No destructive bone lesions. Review of the MIP images confirms the above findings. CT ABDOMEN and PELVIS FINDINGS Hepatobiliary: Diffuse fatty infiltration of the liver. Gallbladder and bile ducts are unremarkable. Pancreas: Unremarkable. No pancreatic ductal dilatation or surrounding inflammatory changes. Spleen: Normal in size without focal abnormality. Adrenals/Urinary Tract: Adrenal glands are unremarkable. Kidneys are normal, without renal calculi, focal lesion, or hydronephrosis. Bladder is unremarkable. Stomach/Bowel: Postoperative changes consistent with gastric sleeve procedure. Stomach is decompressed. Small bowel are decompressed. Scattered gas and stool in the colon without abnormal distention or inflammatory change. Appendix is normal. Vascular/Lymphatic: No significant vascular findings are present. No enlarged abdominal or pelvic lymph nodes. Reproductive: Prostate is unremarkable. Other: No free air or free fluid in the abdomen. Small periumbilical hernia containing fat. No abnormal abdominal or retroperitoneal fluid collections. Musculoskeletal: Degenerative changes in the spine. No destructive bone lesions. Review of the MIP images confirms the above findings. IMPRESSION: 1. Technically limited examination up the pulmonary arteries due to poor contrast bolus. There is evidence of acute pulmonary embolus on the right. No evidence of right heart strain. 2. Atelectasis or linear consolidation in both lower lungs. No effusions. 3. Postoperative changes consistent with gastric sleeve procedure. No evidence of bowel obstruction or perforation. 4. Diffuse fatty infiltration of the liver. These results were called by telephone at the time of interpretation on 03/13/2017 at 4:14 am to Dr.  Joseph Berkshire , who verbally acknowledged these results. Electronically Signed   By: Lucienne Capers M.D.   On: 03/13/2017 04:16   Ct Abdomen Pelvis W Contrast  Result Date: 03/13/2017 CLINICAL DATA:  Abdominal pain. Recent gastric sleeve placement. Right upper quadrant and chest pain with shortness of breath. EXAM: CT ANGIOGRAPHY CHEST CT ABDOMEN AND PELVIS WITH CONTRAST TECHNIQUE: Multidetector CT imaging of the chest was performed using the standard protocol during bolus administration of intravenous contrast. Multiplanar CT image reconstructions and MIPs were obtained to evaluate the vascular anatomy. Multidetector CT imaging of the abdomen and pelvis was performed using the standard protocol during bolus administration of intravenous contrast. CONTRAST:  100 mL Isovue 370 COMPARISON:  None. FINDINGS: CTA CHEST FINDINGS Cardiovascular: Poor contrast bolus limits evaluation of the pulmonary arteries but there is evidence of a large filling defect in the distal right main pulmonary artery, extending into the right lower lobe branches. This is consistent with acute pulmonary embolus. Normal heart size. RA to RV ratio is 0.61, suggesting no evidence of right heart strain. No pericardial effusion. Normal caliber thoracic aorta. Mediastinum/Nodes:  Scattered mediastinal lymph nodes are not pathologically enlarged and are likely reactive. The esophagus is decompressed. No abnormal mediastinal fluid collections. Thyroid gland is normal sized and homogeneous. Lungs/Pleura: Focal areas of atelectasis or linear consolidation in both lower lungs, greater on the right. No pleural effusions. No pneumothorax. Airways are patent. Musculoskeletal: Degenerative changes in the spine. No destructive bone lesions. Review of the MIP images confirms the above findings. CT ABDOMEN and PELVIS FINDINGS Hepatobiliary: Diffuse fatty infiltration of the liver. Gallbladder and bile ducts are unremarkable. Pancreas: Unremarkable.  No pancreatic ductal dilatation or surrounding inflammatory changes. Spleen: Normal in size without focal abnormality. Adrenals/Urinary Tract: Adrenal glands are unremarkable. Kidneys are normal, without renal calculi, focal lesion, or hydronephrosis. Bladder is unremarkable. Stomach/Bowel: Postoperative changes consistent with gastric sleeve procedure. Stomach is decompressed. Small bowel are decompressed. Scattered gas and stool in the colon without abnormal distention or inflammatory change. Appendix is normal. Vascular/Lymphatic: No significant vascular findings are present. No enlarged abdominal or pelvic lymph nodes. Reproductive: Prostate is unremarkable. Other: No free air or free fluid in the abdomen. Small periumbilical hernia containing fat. No abnormal abdominal or retroperitoneal fluid collections. Musculoskeletal: Degenerative changes in the spine. No destructive bone lesions. Review of the MIP images confirms the above findings. IMPRESSION: 1. Technically limited examination up the pulmonary arteries due to poor contrast bolus. There is evidence of acute pulmonary embolus on the right. No evidence of right heart strain. 2. Atelectasis or linear consolidation in both lower lungs. No effusions. 3. Postoperative changes consistent with gastric sleeve procedure. No evidence of bowel obstruction or perforation. 4. Diffuse fatty infiltration of the liver. These results were called by telephone at the time of interpretation on 03/13/2017 at 4:14 am to Dr. Joseph Berkshire , who verbally acknowledged these results. Electronically Signed   By: Lucienne Capers M.D.   On: 03/13/2017 04:16    EKG: Independently reviewed. Normal sinus rhythm.  Assessment/Plan Principal Problem:   Acute pulmonary embolism ([place])    1. Acute pulmonary embolism - unprovoked and patient is hemodynamically stable. Will continue on heparin. Cycle cardiac markers check 2-D echocardiogram and Dopplers of the lower  extremities. Since patient had surgery patient states he cannot take tablets and may need to be crushed. So will need to check with pharmacy if the newer anticoagulants can be crushed. Patient still has significant pleuritic-type of chest pain for which pain relief medications. 2. Recent gastric sleeve surgery - CT abdomen shows postoperative changes. Abdomen appears benign on exam. See #1 regarding tablets. 3. Morbid obesity status post surgery.   DVT prophylaxis: heparin infusion. Code Status: full code.  Family Communication: wife and mother.  Disposition Plan: home.  Consults called: dietitian consult.  Admission status: observation.    Rise Patience MD [facility] Pager [phone].  If 7PM-7AM, please contact night-coverage [email or web address] Password [email or web address]  03/13/2017, 5:29 AM

## 2017-03-13 NOTE — Progress Notes (Signed)
Patient arrived to 3M09 via wheelchair. Patient ambulated to bed with assistance. Denies any pain or discomfort at this time. Wife and Mom at bedside.

## 2017-03-13 NOTE — Progress Notes (Signed)
[facility] PLAN OF CARE NOTE Patient: [name] PHK:[id number]   PCP: Secundino Ginger, PA-C DOB: [birth year]   DOA: 03/13/2017   DOS: 03/13/2017    Patient was admitted by my colleague Dr. Hal Hope  earlier on 03/13/2017. I have reviewed the H&P as well as assessment and plan and agree with the same. Important changes in the plan are listed below.  Plan of care: Principal Problem:   Acute pulmonary embolism ([place]) Active Problems:   Pulmonary embolism ([place]) I discussed with the patient's bariatric surgery service in [place]. They are okay with patient taking any medication for PEand they are okay with anticoagulation for PE as well. Discussed with pharmacy, given patient's weight of 220 kg he would not be a candidate for DOAC, s all of them were studied for patient weighing 100-120 kg. Discussed with patient, best option would be Coumadin with Lovenox bridging. Patient is comfortable with doing injection for himself. Patient was still required for injection on day to meet the dose required for DVT PE. patient [facility] up at [facility], more likely his PCP will be able to manage Coumadin as an outpatient.  Author: Berle Mull, MD [facility] Pager: [phone] 03/13/2017 6:53 PM   If 7PM-7AM, please contact night-coverage at [email or web address], password [facility]

## 2017-03-13 NOTE — Progress Notes (Signed)
Patient educated on lovenox injection. Patient demonstrated understanding. Patient administered lovenox injections without difficulty.

## 2017-03-13 NOTE — Progress Notes (Signed)
Initial Nutrition Assessment  DOCUMENTATION CODES:   Morbid obesity  INTERVENTION:    Premier Protein po TID, each supplement provides 160 kcal and 30 grams of protein   Liquid MVI po daily  NUTRITION DIAGNOSIS:   Increased nutrient needs related to  (post-op healing) as evidenced by estimated needs  GOAL:   Patient will meet greater than or equal to 90% of their needs  MONITOR:   PO intake, Supplement acceptance, Labs, Weight trends, Skin, I & O's  REASON FOR ASSESSMENT:   Consult Assessment of nutrition requirement/status  ASSESSMENT:   41 y/o Male presented to ED with right-sided CP.  RD spoke with pt and wife at bedside. Pt was consuming Full Liquids PTA. He is a week out of the gastric sleeve in [place], [place]. Likes Premier Protein supplements. Will order during hospitalization.  Reports he's lost about 21 lbs since his surgery. He was 526 lbs. Medications reviewed and include Coumadin.  Labs reviewed. CBG 134 (H).  Nutrition focused physical exam completed.  No muscle or subcutaneous fat depletion noticed.  Diet Order:  Diet bariatric full liquid Room service appropriate? Yes; Fluid consistency: Thin  Skin:  Reviewed, no issues  Last BM:  10/13  Height:   Ht Readings from Last 1 Encounters:  03/13/17 6\' 5"  (1.956 m)   Weight:   Wt Readings from Last 1 Encounters:  03/13/17 (!) 505 lb (229.1 kg)   Ideal Body Weight:  94.5 kg  BMI:  Body mass index is 59.88 kg/m.  Estimated Nutritional Needs:   Kcal:  1800-2000  Protein:  80-95 gm  Fluid:  1.8-2.0 L  EDUCATION NEEDS:   No education needs identified at this time  [name], RD, LDN Pager #: [phone] After-Hours Pager #: [phone]

## 2017-03-13 NOTE — ED Notes (Signed)
Attempted report 

## 2017-03-14 ENCOUNTER — Observation Stay (HOSPITAL_BASED_OUTPATIENT_CLINIC_OR_DEPARTMENT_OTHER): Payer: BLUE CROSS/BLUE SHIELD

## 2017-03-14 DIAGNOSIS — Z6841 Body Mass Index (BMI) 40.0 and over, adult: Secondary | ICD-10-CM | POA: Diagnosis not present

## 2017-03-14 DIAGNOSIS — I2699 Other pulmonary embolism without acute cor pulmonale: Secondary | ICD-10-CM | POA: Diagnosis present

## 2017-03-14 DIAGNOSIS — Z9884 Bariatric surgery status: Secondary | ICD-10-CM | POA: Diagnosis not present

## 2017-03-14 DIAGNOSIS — F439 Reaction to severe stress, unspecified: Secondary | ICD-10-CM | POA: Diagnosis present

## 2017-03-14 DIAGNOSIS — R000 Tachycardia, unspecified: Secondary | ICD-10-CM | POA: Diagnosis present

## 2017-03-14 DIAGNOSIS — D72829 Elevated white blood cell count, unspecified: Secondary | ICD-10-CM | POA: Diagnosis present

## 2017-03-14 DIAGNOSIS — J9601 Acute respiratory failure with hypoxia: Secondary | ICD-10-CM | POA: Diagnosis present

## 2017-03-14 DIAGNOSIS — J9811 Atelectasis: Secondary | ICD-10-CM | POA: Diagnosis present

## 2017-03-14 DIAGNOSIS — R109 Unspecified abdominal pain: Secondary | ICD-10-CM | POA: Diagnosis present

## 2017-03-14 DIAGNOSIS — M5489 Other dorsalgia: Secondary | ICD-10-CM | POA: Diagnosis present

## 2017-03-14 LAB — CBC
HCT: 37.8 % — ABNORMAL LOW (ref 39.0–52.0)
Hemoglobin: 12.7 g/dL — ABNORMAL LOW (ref 13.0–17.0)
MCH: 30 pg (ref 26.0–34.0)
MCHC: 33.6 g/dL (ref 30.0–36.0)
MCV: 89.4 fL (ref 78.0–100.0)
Platelets: 270 10*3/uL (ref 150–400)
RBC: 4.23 MIL/uL (ref 4.22–5.81)
RDW: 14.3 % (ref 11.5–15.5)
WBC: 15.5 10*3/uL — ABNORMAL HIGH (ref 4.0–10.5)

## 2017-03-14 LAB — PROTIME-INR
INR: 1.18
Prothrombin Time: 14.9 seconds (ref 11.4–15.2)

## 2017-03-14 LAB — COMPREHENSIVE METABOLIC PANEL
ALT: 66 U/L — ABNORMAL HIGH (ref 17–63)
AST: 36 U/L (ref 15–41)
Albumin: 3 g/dL — ABNORMAL LOW (ref 3.5–5.0)
Alkaline Phosphatase: 90 U/L (ref 38–126)
Anion gap: 7 (ref 5–15)
BUN: 6 mg/dL (ref 6–20)
CO2: 27 mmol/L (ref 22–32)
Calcium: 9 mg/dL (ref 8.9–10.3)
Chloride: 102 mmol/L (ref 101–111)
Creatinine, Ser: 0.85 mg/dL (ref 0.61–1.24)
GFR calc Af Amer: >60 mL/min (ref >60)
GFR calc non Af Amer: >60 mL/min (ref >60)
Glucose, Bld: 114 mg/dL — ABNORMAL HIGH (ref 65–99)
Potassium: 4.4 mmol/L (ref 3.5–5.1)
Sodium: 136 mmol/L (ref 135–145)
Total Bilirubin: 1.4 mg/dL — ABNORMAL HIGH (ref 0.3–1.2)
Total Protein: 7.5 g/dL (ref 6.5–8.1)

## 2017-03-14 LAB — MAGNESIUM: Magnesium: 2.1 mg/dL (ref 1.7–2.4)

## 2017-03-14 LAB — HEPARIN ANTI-XA: Heparin LMW: 0.87 IU/mL

## 2017-03-14 MED ORDER — PATIENT'S GUIDE TO USING COUMADIN BOOK
Freq: Once | Status: AC
  Administered 2017-03-14: 15:00:00
  Filled 2017-03-14: qty 1

## 2017-03-14 MED ORDER — WARFARIN SODIUM 7.5 MG PO TABS
15.0000 mg | ORAL_TABLET | Freq: Once | ORAL | Status: AC
  Administered 2017-03-14: 15 mg via ORAL
  Filled 2017-03-14: qty 2

## 2017-03-14 MED ORDER — WARFARIN VIDEO
Freq: Once | Status: AC
  Administered 2017-03-14: 15:00:00

## 2017-03-14 NOTE — Progress Notes (Addendum)
ANTICOAGULATION CONSULT NOTE --Follow up Pharmacy Consult for Lovenox and Warfarin Indication: pulmonary embolus  No Known Allergies  Patient Measurements: Height: 6\' 5"  (195.6 cm) Weight: (!) 508 lb 12.8 oz (230.8 kg) IBW/kg (Calculated) : 89.1  Heparin dosing weight: 146 kg  Assessment: 41 y/o M presented to ED on 03/13/17 with right-sided CP. Pt just had gastric sleeve surgery on 03/07/17. Technically limited CT scan showing evidence of PE with no heart strain. Initiallly started on IV heparin drip which was later changed to Lovenox SQ bridge to Warfarin.  Given patient's weight of 230 kg he would not be a candidate for DOACs, all of them were studied for patient weighing 100-120 kg.  Day # 2 overlap Lovenox bridge/Warfarin INR = 1.18 4 hour antiXa level (low molecular weight heparin) = 0.87 on lovenox 240 mg [place] q12h which is therapeutic. Goal 4 hour antiXa level = 0.6-1.0 units/ml CBC on admit wnl. Today H/H down some to 12.7/37.8 and PLTC remains wnl.  Renal function/SCr is within normal limits. Venous duplex LE : pending  For discharge ,will need to inject two Lovenox syringes of 120mg  [place] Q12h for bridge to total 240mg  [place] q12h  Goal of Therapy:  AntiXa 0.6-1.0 units/mL (4hr post a dose) Monitor platelets by anticoagulation protocol: Yes   Plan:  Continue enoxaparin 240mg  [place] Q12h (Separate into two 120mg  injections for each dose q12h) Give Coumadin 15mg  PO x 1 Monitor antiXa, CBC, s/s of bleed   Thank you for allowing pharmacy to be part of this patients care team. Nicole Cella, [facility] Clinical Pharmacist Pager: [phone] [phone] phone [phone] or [place] [phone] 03/14/2017 1:31 PM

## 2017-03-14 NOTE — Plan of Care (Signed)
Problem: Pain Managment: Goal: General experience of comfort will improve Outcome: Not Progressing Pt requiring pain medication q3h for pain in lower back/side. Pt also experiencing pain when taking a deep breath.   Problem: Physical Regulation: Goal: Ability to maintain clinical measurements within normal limits will improve Outcome: Progressing Pt on lovenox for acute PE. Pt understands the purpose of the medication and has asked appropriate questions regarding its continued use. All concerns have been addressed at this time.   Problem: Nutrition: Goal: Adequate nutrition will be maintained Outcome: Progressing Pt on a bariatric full liquid diet; tolerating well.

## 2017-03-14 NOTE — Discharge Instructions (Signed)

## 2017-03-14 NOTE — Progress Notes (Signed)
*  PRELIMINARY RESULTS* Vascular Ultrasound Lower ext venous duplex has been completed.  Preliminary findings: no obvious evidence of DVT in visualized veins.    Landry Mellow, RDMS, RVT  03/14/2017, 10:45 AM

## 2017-03-14 NOTE — Progress Notes (Signed)
[facility] Progress Note  Patient: [name] IOM:[id number]   PCP: Secundino Ginger, PA-C DOB: 05-07-1976   DOA: 03/13/2017   DOS: 03/14/2017   Date of Service: the patient was seen and examined on 03/14/2017  Subjective: has pain on the right side now radiating more on the back. Has trouble breathing as well as heart rate goes fast when he is sitting up. Denies any dizziness or lightheadedness. No nausea or vomiting abdominal pain. Oral intake is appropriate. No diarrhea no constipation.  Brief hospital course: Pt. with PMH of morbid obesity, BMI more than 40, underwent a recent gastric sleeve resection last week; admitted on 03/13/2017, presented with complaint of chest pain and shortness of breath, was found to have pulmonary embolism on the right. Currently further plan is continue pain control and symptom management.  Assessment and Plan: 1. Right pulmonary embolism. Sinus tachycardia. Tachypnea. Acute hypoxic respiratory failure. Patient presented with complaints of pain located in the right upper quadrant radiating to his back. CT chest was positive for right-sided pulmonary embolism. Bilateral basal atelectasis. Patient was started on IV heparin, currently based on patient's weightof 220 kg his only option is Coumadin for chronic anticoagulation along with Lovenox bridge. Patient started on 200 mg Lovenox twice a day as well as Coumadin per pharmacy. Monitor anti-10 A level to ensure stability before discharge. Echocardiogram shows no evidence of significant RV failure. Troponins are unremarkable as well. Discussed with patient's bariatric surgeon, no contraindication for anticoagulation. Lower extremity Doppler negative for DVT.  2. Morbid obesity. BMI more than 40. Recent gastric sleeve surgery. Discussed with patient's primary bariatric surgery attending at [facility]. No contraindication for anticoagulation. Follow-up appointment is scheduled on  Friday. Bariatric full liquid diet for now.  3. Abdominal pain. CT abdomen shows no evidence of acute abnormality. Postoperative changes are seen. LFTs are stable renal function stable. Monitor.  4. Leukocytosis. Likely from stress reaction from recent surgery as well as PE. Continue incentive spirometry.  Diet: full liquid diet  Advance goals of care discussion: full code  Family Communication: family was present at bedside, at the time of interview. The pt provided permission to discuss medical plan with the family. Opportunity was given to ask question and all questions were answered satisfactorily.   Disposition:  Discharge to home.  Consultants: none Procedures: none  Antibiotics: Anti-infectives    None       Objective: Physical Exam: Vitals:   03/13/17 2300 03/13/17 2330 03/14/17 0400 03/14/17 1408  BP: 137/84  (!) 147/87 123/66  Pulse: (!) 109   85  Resp: (!) 24  16 18   Temp: 99.5 F (37.5 C)  (!) 97.5 F (36.4 C) 99.2 F (37.3 C)  TempSrc: Oral  Oral Oral  SpO2: 94%  100% 100%  Weight:  (!) 230.8 kg (508 lb 12.8 oz)    Height: 6\' 5"  (1.956 m)       Intake/Output Summary (Last 24 hours) at 03/14/17 1926 Last data filed at 03/14/17 0759  Gross per 24 hour  Intake                0 ml  Output              850 ml  Net             -850 ml   Filed Weights   03/13/17 0314 03/13/17 0759 03/13/17 2330  Weight: (!) 230.4 kg (508 lb) (!) 229.1 kg (505 lb) (!) 230.8 kg (508 lb 12.8  oz)   General: Alert, Awake and Oriented to Time, Place and Person. Appear in mild distress, affect appropriate Eyes: PERRL, Conjunctiva normal ENT: Oral Mucosa clear moist. Neck: difficult to assess JVD, no Abnormal Mass Or lumps Cardiovascular: S1 and S2 Present, no Murmur, Peripheral Pulses Present Respiratory: increased respiratory effort, Bilateral Air entry equal and Decreased, no use of accessory muscle, basal Crackles, no wheezes Abdomen: Bowel Sound present, Soft and no  tenderness, no hernia Skin: no redness, no Rash, no induration Extremities: trace Pedal edema, no calf tenderness Neurologic: Grossly no focal neuro deficit. Bilaterally Equal motor strength  Data Reviewed: CBC:  Recent Labs Lab 03/12/17 2200 03/13/17 1224 03/14/17 0341  WBC 14.7* 16.1* 15.5*  NEUTROABS 11.2* 12.4*  --   HGB 14.1 13.6 12.7*  HCT 43.4 41.6 37.8*  MCV 87.7 88.7 89.4  PLT 307 274 924   Basic Metabolic Panel:  Recent Labs Lab 03/12/17 2200 03/13/17 1224 03/14/17 0341  NA 133* 136 136  K 4.1 4.1 4.4  CL 103 102 102  CO2 22 23 27   GLUCOSE 132* 134* 114*  BUN 11 7 6   CREATININE 1.11 1.05 0.85  CALCIUM 8.8* 8.9 9.0  MG  --   --  2.1    Liver Function Tests:  Recent Labs Lab 03/12/17 2200 03/13/17 1224 03/14/17 0341  AST 45* 32 36  ALT 85* 71* 66*  ALKPHOS 76 81 90  BILITOT 2.1* 1.9* 1.4*  PROT 8.3* 8.2* 7.5  ALBUMIN 3.7 3.3* 3.0*    Recent Labs Lab 03/12/17 2200  LIPASE 40   No results for input(s): AMMONIA in the last 168 hours. Coagulation Profile:  Recent Labs Lab 03/14/17 0341  INR 1.18   Cardiac Enzymes:  Recent Labs Lab 03/12/17 2200 03/13/17 0536 03/13/17 1114 03/13/17 1819  TROPONINI <0.03 <0.03 <0.03 <0.03   BNP (last 3 results) No results for input(s): PROBNP in the last 8760 hours. CBG: No results for input(s): GLUCAP in the last 168 hours. Studies: No results found.  Scheduled Meds: . enoxaparin (LOVENOX) injection  120 mg Subcutaneous Q12H   And  . enoxaparin (LOVENOX) injection  120 mg Subcutaneous Q12H  . multivitamin  15 mL Oral Daily  . protein supplement shake  11 oz Oral TID BM  . Warfarin - Pharmacist Dosing Inpatient   Does not apply q1800   Continuous Infusions: PRN Meds: acetaminophen **OR** acetaminophen, cyclobenzaprine, HYDROcodone-acetaminophen, HYDROmorphone (DILAUDID) injection, ondansetron **OR** ondansetron (ZOFRAN) IV, simethicone  Time spent: 35 minutes  Author: Berle Mull,  MD [facility] Pager: [phone] 03/14/2017 7:26 PM  If 7PM-7AM, please contact night-coverage at [email or web address], password [facility]

## 2017-03-15 DIAGNOSIS — I2699 Other pulmonary embolism without acute cor pulmonale: Principal | ICD-10-CM

## 2017-03-15 LAB — PROTIME-INR
INR: 1.38
Prothrombin Time: 16.9 seconds — ABNORMAL HIGH (ref 11.4–15.2)

## 2017-03-15 LAB — CBC
HCT: 37.9 % — ABNORMAL LOW (ref 39.0–52.0)
Hemoglobin: 12.1 g/dL — ABNORMAL LOW (ref 13.0–17.0)
MCH: 28.5 pg (ref 26.0–34.0)
MCHC: 31.9 g/dL (ref 30.0–36.0)
MCV: 89.2 fL (ref 78.0–100.0)
Platelets: 297 10*3/uL (ref 150–400)
RBC: 4.25 MIL/uL (ref 4.22–5.81)
RDW: 14.3 % (ref 11.5–15.5)
WBC: 14.5 10*3/uL — ABNORMAL HIGH (ref 4.0–10.5)

## 2017-03-15 MED ORDER — PREMIER PROTEIN SHAKE: 11.0000 [oz_av] | Freq: Three times a day (TID) | ORAL | 0 refills | Status: DC

## 2017-03-15 MED ORDER — WARFARIN SODIUM 7.5 MG PO TABS: 15.0000 mg | ORAL_TABLET | Freq: Once | ORAL | 0 refills | Status: DC

## 2017-03-15 MED ORDER — WARFARIN SODIUM 7.5 MG PO TABS: 15.0000 mg | ORAL_TABLET | Freq: Once | ORAL | Status: DC

## 2017-03-15 MED ORDER — HYDROCODONE-ACETAMINOPHEN 5-325 MG PO TABS: 1.0000 | ORAL_TABLET | ORAL | 0 refills | Status: DC | PRN

## 2017-03-15 MED ORDER — DICLOFENAC SODIUM 1 % TD GEL
4.0000 g | Freq: Four times a day (QID) | TRANSDERMAL | Status: DC
  Filled 2017-03-15: qty 100

## 2017-03-15 MED ORDER — ENOXAPARIN SODIUM 120 MG/0.8ML ~~LOC~~ SOLN: 120.0000 mg | Freq: Two times a day (BID) | SUBCUTANEOUS | 0 refills | Status: DC

## 2017-03-15 MED ORDER — DICLOFENAC SODIUM 1 % TD GEL: 4.0000 g | Freq: Four times a day (QID) | TRANSDERMAL | Status: DC

## 2017-03-15 NOTE — Progress Notes (Signed)
ANTICOAGULATION CONSULT NOTE - Follow Up Consult  Pharmacy Consult for Coumadin Indication: pulmonary embolus  No Known Allergies  Patient Measurements: Height: 6\' 5"  (195.6 cm) Weight: (!) 503 lb 6.4 oz (228.3 kg) IBW/kg (Calculated) : 89.1  Vital Signs: Temp: 99.2 F (37.3 C) (10/17 0336) Temp Source: Oral (10/17 0336) BP: 138/83 (10/17 0336) Pulse Rate: 93 (10/17 0336)  Labs:  Recent Labs  03/12/17 2200 03/13/17 0536 03/13/17 1114 03/13/17 1224 03/13/17 1819 03/14/17 0341 03/14/17 1138 03/15/17 0431  HGB 14.1  --   --  13.6  --  12.7*  --  12.1*  HCT 43.4  --   --  41.6  --  37.8*  --  37.9*  PLT 307  --   --  274  --  270  --  297  LABPROT  --   --   --   --   --  14.9  --  16.9*  INR  --   --   --   --   --  1.18  --  1.38  HEPARINUNFRC  --   --   --  0.16*  --   --   --   --   HEPRLOWMOCWT  --   --   --   --   --   --  0.87  --   CREATININE 1.11  --   --  1.05  --  0.85  --   --   TROPONINI <0.03 <0.03 <0.03  --  <0.03  --   --   --     Estimated Creatinine Clearance: 236.6 mL/min (by C-G formula based on SCr of 0.85 mg/dL).   Assessment:  Anticoag Acute PE s/p recent gastric sleeve surgery on 03/07/17. Baseline CBC WNL Initiallly started on IV heparin drip 10/15 then switched 10/15 to Lovenox and Coumadin bridge. Day # 3 overlap. INR 1.38. Hgb 12.1 dropping slightly. Plts stable. Renal WNL - 10/16: 4 hour antiXa level (low molecular weight heparin) = 0.87 (0.6-1.2) on lovenox 240 mg [place] q12h -  (warf dosing pts = 11pts:  male=2, age 22 = 3, wt 230kg = 3, race=black = 2, indication PE = 1 =Total pts 11)   This is a complicated case on what agent is best to use. Could consider DOAC although there is no literature with patients that weigh over 200 kgs. Another problem with a DOAC is we would not be able to monitor therapeutic effects and with a recent gastric sleeve resection absorption may be significantly decreased. CBC stable.    Goal of Therapy:  Anti-Xa  level 0.6-1 units/ml 4hrs after LMWH dose given  INR 2-3 Monitor platelets by anticoagulation protocol: Yes   Plan:  Lovenox 240mg  BID. CBC q72h inpatient Repeat Coumadin 15mg  daily until INR check on Friday.  Daily INR inpatient.   [name], PharmD, [facility] Clinical Staff Pharmacist Pager [phone]  [name] 03/15/2017,8:14 AM

## 2017-03-15 NOTE — Discharge Summary (Signed)
Physician Discharge Summary  Royal Vandevoort DVV:[id number] DOB: 1975/09/02 DOA: 03/13/2017  PCP: Secundino Ginger, PA-C  Admit date: 03/13/2017 Discharge date: 03/15/2017  Admitted From: home  Disposition:  home   Recommendations for Outpatient Follow-up:  1. F/u INR on Friday  Discharge Condition:  stable   CODE STATUS:  Full code   Consultations:  none   Discharge Diagnoses:  Principal Problem:   Acute pulmonary embolism ([place]) Active Problems:   Obesity, Class III, BMI 40-49.9 (morbid obesity) (HCC)    Subjective: Pain in right upper back today worse with movement and deep breaths. No other complaints. Ambulated to the nursing station and back without dyspnea.   Brief Summary: Pt. with PMH of morbid obesity who underwent a recent gastric sleeve resection last week who is sent from Urgent care and presents with complaint of chest pain and shortness of breath. Noted increased pedal edema over the last few days as well along with abdominal pain. He was found to have pulmonary embolism on the right.    Hospital Course:  Right sided PE - with sinus tachycardia, tachypnea and hypoxia - started on Heparin and then transitioned to Lovenox/ Coumadin and NOAC contraindicated due to weight - factor Xa level checked and is in the appropriate range - 2D ECHO unrevealing - report below - Lower extremity Doppler negative for DVT - INR to be checked by PCP in 2 days- he has an appt for Friday (I called office)- discussed with patient - no longer hypoxic and has ambulated > 200 feet with pulse ox > 92% with exertion - Dr Berle Mull spoke with patient's bariatric surgeon, no contraindication for anticoagulation.   Right upper back pain - having pain in right upper back- noted to be reproducible and quite tender on exam today - Voltaren gel ordered along with PRN Hydrocodone for his pain   Morbid obesity BMI more than 40. Recent gastric sleeve surgery. Discussed with patient's  primary bariatric surgery attending at [facility]. No contraindication for anticoagulation. Follow-up appointment is scheduled on Friday. Bariatric full liquid diet   Abdominal pain CT abdomen shows no evidence of acute abnormality. Postoperative changes are seen. LFTs & renal function stable.  Leukocytosis Likely from stress reaction from recent surgery as well as PE. Continue incentive spirometry.  Discharge Instructions  Discharge Instructions    Diet general    Complete by:  As directed    Bariatric, full liquid diet   Increase activity slowly    Complete by:  As directed      Allergies as of 03/15/2017   No Known Allergies     Medication List    STOP taking these medications   HYDROcodone-acetaminophen 7.5-325 mg/15 ml solution Commonly known as:  HYCET Replaced by:  HYDROcodone-acetaminophen 5-325 MG tablet   naproxen sodium 220 MG tablet Commonly known as:  ANAPROX     TAKE these medications   BARIATRIC MULTIVITAMINS/IRON PO Take 1 tablet by mouth daily.   cyclobenzaprine 10 MG tablet Commonly known as:  FLEXERIL Take 1 tablet (10 mg total) by mouth 2 (two) times daily as needed for muscle spasms.   diclofenac sodium 1 % Gel Commonly known as:  VOLTAREN Apply 4 g topically 4 (four) times daily.   enoxaparin 120 MG/0.8ML injection Commonly known as:  LOVENOX Inject 0.8 mLs (120 mg total) into the skin every 12 (twelve) hours.   HYDROcodone-acetaminophen 5-325 MG tablet Commonly known as:  NORCO/VICODIN Take 1-2 tablets by mouth every 4 (four) hours as needed  for moderate pain. Replaces:  HYDROcodone-acetaminophen 7.5-325 mg/15 ml solution   omeprazole 20 MG capsule Commonly known as:  PRILOSEC Take 40 mg by mouth daily. Open capsule and pour into some type of food or liquid   ondansetron 4 MG disintegrating tablet Commonly known as:  ZOFRAN-ODT Take 4 mg by mouth every 8 (eight) hours as needed for nausea or vomiting.   protein supplement shake  Liqd Commonly known as:  PREMIER PROTEIN Take 325 mLs (11 oz total) by mouth 3 (three) times daily between meals.   simethicone 40 MG/0.6ML drops Commonly known as:  MYLICON Take 40 mg by mouth 4 (four) times daily as needed for flatulence.   warfarin 7.5 MG tablet Commonly known as:  COUMADIN Take 2 tablets (15 mg total) by mouth one time only at 6 PM. Take 15 mg tonight and 10 mg tomorrow and have INR checked on Friday      Follow-up Information    Secundino Ginger, PA-C Follow up.   Specialty:  Cardiology Why:  you have and appt for Friday at 3:15- you will need an INR checked that day. Patient received 15 mg of Coumadin x 3 days and then 10 mg.  Contact information: [facility]  [address] [place] [address] [phone]          No Known Allergies   Procedures/Studies: Vascular Ultrasound Lower ext venous duplex has been completed.  Preliminary findings: no obvious evidence of DVT in visualized veins.  2 D ECHO Left ventricle: The cavity size was normal. Wall thickness was   increased in a pattern of moderate LVH. Systolic function was   normal. The estimated ejection fraction was in the range of 60%   to 65%. Wall motion was normal; there were no regional wall   motion abnormalities. Left ventricular diastolic function   parameters were normal. - Left atrium: The atrium was mildly dilated.   Dg Chest 2 View  Result Date: 03/12/2017 CLINICAL DATA:  Patient had recent gastric sleeve surgery. Reports that he is having pain from his right upper quadrant region up into his right chest. He reports that it is worse with movement and changing positions. EXAM: CHEST  2 VIEW COMPARISON:  01/24/2017 FINDINGS: Normal cardiac silhouette there is low lung volumes with bibasilar atelectasis. No focal consolidation. No pneumothorax IMPRESSION: Low lung volumes and bibasilar atelectasis versus infiltrate. Favor atelectasis Electronically Signed   By: Suzy Bouchard M.D.   On: 03/12/2017 21:49   Dg Tibia/fibula Left  Result Date: 02/16/2017 CLINICAL DATA:  Injured the ankle yesterday with pain EXAM: LEFT TIBIA AND FIBULA - 2 VIEW COMPARISON:  Left ankle films from 02/15/2017 FINDINGS: The left tibia and fibula are are intact and normally aligned. Varicosities are noted in the soft tissues medially. No fracture is seen. There are degenerative changes in the midfoot and hindfoot. IMPRESSION: Degenerative change in the mid and hindfoot. No acute abnormality of the left lower extremity. Electronically Signed   By: Ivar Drape M.D.   On: 02/16/2017 10:31   Dg Ankle Complete Left  Result Date: 02/15/2017 CLINICAL DATA:  41 year old who injured the left ankle stepping off of a curb earlier today, audible pop at the time of the injury. Initial encounter. EXAM: LEFT ANKLE COMPLETE - 3+ VIEW COMPARISON:  06/24/2013, 11/01/2012. FINDINGS: Diffuse soft tissue swelling, medial greater than lateral, a chronic finding. No evidence of acute fracture. Ankle mortise intact with well-preserved joint space. Well-preserved bone mineral density. No  visible joint effusion. Small plantar calcaneal spur. IMPRESSION: No acute osseous abnormality. Electronically Signed   By: Evangeline Dakin M.D.   On: 02/15/2017 09:46   Dg Abd 1 View  Result Date: 03/12/2017 CLINICAL DATA:  Patient had recent gastric sleeve surgery. Reports that he is having pain from his right upper quadrant region up into his right chest. He reports that it is worse with movement and changing positions. EXAM: ABDOMEN - 1 VIEW COMPARISON:  None. FINDINGS: No dilated large or small bowel.  No intraperitoneal free air. IMPRESSION: No evidence of bowel obstruction. Electronically Signed   By: Suzy Bouchard M.D.   On: 03/12/2017 21:50   Ct Angio Chest Pe W Or Wo Contrast  Result Date: 03/13/2017 CLINICAL DATA:  Abdominal pain. Recent gastric sleeve placement. Right upper quadrant and chest pain with shortness  of breath. EXAM: CT ANGIOGRAPHY CHEST CT ABDOMEN AND PELVIS WITH CONTRAST TECHNIQUE: Multidetector CT imaging of the chest was performed using the standard protocol during bolus administration of intravenous contrast. Multiplanar CT image reconstructions and MIPs were obtained to evaluate the vascular anatomy. Multidetector CT imaging of the abdomen and pelvis was performed using the standard protocol during bolus administration of intravenous contrast. CONTRAST:  100 mL Isovue 370 COMPARISON:  None. FINDINGS: CTA CHEST FINDINGS Cardiovascular: Poor contrast bolus limits evaluation of the pulmonary arteries but there is evidence of a large filling defect in the distal right main pulmonary artery, extending into the right lower lobe branches. This is consistent with acute pulmonary embolus. Normal heart size. RA to RV ratio is 0.61, suggesting no evidence of right heart strain. No pericardial effusion. Normal caliber thoracic aorta. Mediastinum/Nodes: Scattered mediastinal lymph nodes are not pathologically enlarged and are likely reactive. The esophagus is decompressed. No abnormal mediastinal fluid collections. Thyroid gland is normal sized and homogeneous. Lungs/Pleura: Focal areas of atelectasis or linear consolidation in both lower lungs, greater on the right. No pleural effusions. No pneumothorax. Airways are patent. Musculoskeletal: Degenerative changes in the spine. No destructive bone lesions. Review of the MIP images confirms the above findings. CT ABDOMEN and PELVIS FINDINGS Hepatobiliary: Diffuse fatty infiltration of the liver. Gallbladder and bile ducts are unremarkable. Pancreas: Unremarkable. No pancreatic ductal dilatation or surrounding inflammatory changes. Spleen: Normal in size without focal abnormality. Adrenals/Urinary Tract: Adrenal glands are unremarkable. Kidneys are normal, without renal calculi, focal lesion, or hydronephrosis. Bladder is unremarkable. Stomach/Bowel: Postoperative changes  consistent with gastric sleeve procedure. Stomach is decompressed. Small bowel are decompressed. Scattered gas and stool in the colon without abnormal distention or inflammatory change. Appendix is normal. Vascular/Lymphatic: No significant vascular findings are present. No enlarged abdominal or pelvic lymph nodes. Reproductive: Prostate is unremarkable. Other: No free air or free fluid in the abdomen. Small periumbilical hernia containing fat. No abnormal abdominal or retroperitoneal fluid collections. Musculoskeletal: Degenerative changes in the spine. No destructive bone lesions. Review of the MIP images confirms the above findings. IMPRESSION: 1. Technically limited examination up the pulmonary arteries due to poor contrast bolus. There is evidence of acute pulmonary embolus on the right. No evidence of right heart strain. 2. Atelectasis or linear consolidation in both lower lungs. No effusions. 3. Postoperative changes consistent with gastric sleeve procedure. No evidence of bowel obstruction or perforation. 4. Diffuse fatty infiltration of the liver. These results were called by telephone at the time of interpretation on 03/13/2017 at 4:14 am to Dr. Joseph Berkshire , who verbally acknowledged these results. Electronically Signed   By: Oren Beckmann.D.  On: 03/13/2017 04:16   Ct Abdomen Pelvis W Contrast  Result Date: 03/13/2017 CLINICAL DATA:  Abdominal pain. Recent gastric sleeve placement. Right upper quadrant and chest pain with shortness of breath. EXAM: CT ANGIOGRAPHY CHEST CT ABDOMEN AND PELVIS WITH CONTRAST TECHNIQUE: Multidetector CT imaging of the chest was performed using the standard protocol during bolus administration of intravenous contrast. Multiplanar CT image reconstructions and MIPs were obtained to evaluate the vascular anatomy. Multidetector CT imaging of the abdomen and pelvis was performed using the standard protocol during bolus administration of intravenous contrast.  CONTRAST:  100 mL Isovue 370 COMPARISON:  None. FINDINGS: CTA CHEST FINDINGS Cardiovascular: Poor contrast bolus limits evaluation of the pulmonary arteries but there is evidence of a large filling defect in the distal right main pulmonary artery, extending into the right lower lobe branches. This is consistent with acute pulmonary embolus. Normal heart size. RA to RV ratio is 0.61, suggesting no evidence of right heart strain. No pericardial effusion. Normal caliber thoracic aorta. Mediastinum/Nodes: Scattered mediastinal lymph nodes are not pathologically enlarged and are likely reactive. The esophagus is decompressed. No abnormal mediastinal fluid collections. Thyroid gland is normal sized and homogeneous. Lungs/Pleura: Focal areas of atelectasis or linear consolidation in both lower lungs, greater on the right. No pleural effusions. No pneumothorax. Airways are patent. Musculoskeletal: Degenerative changes in the spine. No destructive bone lesions. Review of the MIP images confirms the above findings. CT ABDOMEN and PELVIS FINDINGS Hepatobiliary: Diffuse fatty infiltration of the liver. Gallbladder and bile ducts are unremarkable. Pancreas: Unremarkable. No pancreatic ductal dilatation or surrounding inflammatory changes. Spleen: Normal in size without focal abnormality. Adrenals/Urinary Tract: Adrenal glands are unremarkable. Kidneys are normal, without renal calculi, focal lesion, or hydronephrosis. Bladder is unremarkable. Stomach/Bowel: Postoperative changes consistent with gastric sleeve procedure. Stomach is decompressed. Small bowel are decompressed. Scattered gas and stool in the colon without abnormal distention or inflammatory change. Appendix is normal. Vascular/Lymphatic: No significant vascular findings are present. No enlarged abdominal or pelvic lymph nodes. Reproductive: Prostate is unremarkable. Other: No free air or free fluid in the abdomen. Small periumbilical hernia containing fat. No  abnormal abdominal or retroperitoneal fluid collections. Musculoskeletal: Degenerative changes in the spine. No destructive bone lesions. Review of the MIP images confirms the above findings. IMPRESSION: 1. Technically limited examination up the pulmonary arteries due to poor contrast bolus. There is evidence of acute pulmonary embolus on the right. No evidence of right heart strain. 2. Atelectasis or linear consolidation in both lower lungs. No effusions. 3. Postoperative changes consistent with gastric sleeve procedure. No evidence of bowel obstruction or perforation. 4. Diffuse fatty infiltration of the liver. These results were called by telephone at the time of interpretation on 03/13/2017 at 4:14 am to Dr. Joseph Berkshire , who verbally acknowledged these results. Electronically Signed   By: Lucienne Capers M.D.   On: 03/13/2017 04:16       Discharge Exam: Vitals:   03/15/17 0336 03/15/17 0806  BP: 138/83 120/85  Pulse: 93 90  Resp: 20 18  Temp: 99.2 F (37.3 C)   SpO2: 98% 92%   Vitals:   03/14/17 1957 03/15/17 0336 03/15/17 0556 03/15/17 0806  BP: 125/87 138/83  120/85  Pulse: 100 93  90  Resp: (!) 24 20  18   Temp: 100.1 F (37.8 C) 99.2 F (37.3 C)    TempSrc: Oral Oral    SpO2: 100% 98%  92%  Weight:   (!) 228.3 kg (503 lb 6.4 oz)   Height:  General: Pt is alert, awake, not in acute distress Cardiovascular: RRR, S1/S2 +, no rubs, no gallops Respiratory: CTA bilaterally, no wheezing, no rhonchi Abdominal: Soft, NT, ND, bowel sounds + MSK: significantly tender along right mid back in scapular area Extremities: no edema, no cyanosis    The results of significant diagnostics from this hospitalization (including imaging, microbiology, ancillary and laboratory) are listed below for reference.     Microbiology: Recent Results (from the past 240 hour(s))  MRSA PCR Screening     Status: None   Collection Time: 03/13/17  8:08 AM  Result Value Ref Range Status    MRSA by PCR NEGATIVE NEGATIVE Final    Comment:        The GeneXpert MRSA Assay (FDA approved for NASAL specimens only), is one component of a comprehensive MRSA colonization surveillance program. It is not intended to diagnose MRSA infection nor to guide or monitor treatment for MRSA infections.      Labs: BNP (last 3 results)  Recent Labs  01/16/17 1215  BNP 28.3   Basic Metabolic Panel:  Recent Labs Lab 03/12/17 2200 03/13/17 1224 03/14/17 0341  NA 133* 136 136  K 4.1 4.1 4.4  CL 103 102 102  CO2 22 23 27   GLUCOSE 132* 134* 114*  BUN 11 7 6   CREATININE 1.11 1.05 0.85  CALCIUM 8.8* 8.9 9.0  MG  --   --  2.1   Liver Function Tests:  Recent Labs Lab 03/12/17 2200 03/13/17 1224 03/14/17 0341  AST 45* 32 36  ALT 85* 71* 66*  ALKPHOS 76 81 90  BILITOT 2.1* 1.9* 1.4*  PROT 8.3* 8.2* 7.5  ALBUMIN 3.7 3.3* 3.0*    Recent Labs Lab 03/12/17 2200  LIPASE 40   No results for input(s): AMMONIA in the last 168 hours. CBC:  Recent Labs Lab 03/12/17 2200 03/13/17 1224 03/14/17 0341 03/15/17 0431  WBC 14.7* 16.1* 15.5* 14.5*  NEUTROABS 11.2* 12.4*  --   --   HGB 14.1 13.6 12.7* 12.1*  HCT 43.4 41.6 37.8* 37.9*  MCV 87.7 88.7 89.4 89.2  PLT 307 274 270 297   Cardiac Enzymes:  Recent Labs Lab 03/12/17 2200 03/13/17 0536 03/13/17 1114 03/13/17 1819  TROPONINI <0.03 <0.03 <0.03 <0.03   BNP: Invalid input(s): POCBNP CBG: No results for input(s): GLUCAP in the last 168 hours. D-Dimer No results for input(s): DDIMER in the last 72 hours. Hgb A1c No results for input(s): HGBA1C in the last 72 hours. Lipid Profile No results for input(s): CHOL, HDL, LDLCALC, TRIG, CHOLHDL, LDLDIRECT in the last 72 hours. Thyroid function studies No results for input(s): TSH, T4TOTAL, T3FREE, THYROIDAB in the last 72 hours.  Invalid input(s): FREET3 Anemia work up No results for input(s): VITAMINB12, FOLATE, FERRITIN, TIBC, IRON, RETICCTPCT in the last 72  hours. Urinalysis    Component Value Date/Time   COLORURINE YELLOW 09/01/2008 0109   APPEARANCEUR CLOUDY (A) 09/01/2008 0109   LABSPEC 1.028 09/01/2008 0109   PHURINE 6.0 09/01/2008 0109   GLUCOSEU NEGATIVE 09/01/2008 0109   HGBUR NEGATIVE 09/01/2008 0109   BILIRUBINUR NEGATIVE 09/01/2008 0109   KETONESUR NEGATIVE 09/01/2008 0109   PROTEINUR NEGATIVE 09/01/2008 0109   UROBILINOGEN 1.0 09/01/2008 0109   NITRITE NEGATIVE 09/01/2008 0109   LEUKOCYTESUR SMALL (A) 09/01/2008 0109   Sepsis Labs Invalid input(s): PROCALCITONIN,  WBC,  LACTICIDVEN Microbiology Recent Results (from the past 240 hour(s))  MRSA PCR Screening     Status: None   Collection Time: 03/13/17  8:08  AM  Result Value Ref Range Status   MRSA by PCR NEGATIVE NEGATIVE Final    Comment:        The GeneXpert MRSA Assay (FDA approved for NASAL specimens only), is one component of a comprehensive MRSA colonization surveillance program. It is not intended to diagnose MRSA infection nor to guide or monitor treatment for MRSA infections.      Time coordinating discharge: Over 30 minutes  SIGNED:   Debbe Odea, MD  [facility] 03/15/2017, 12:21 PM Pager   If 7PM-7AM, please contact night-coverage [email or web address] Password [email or web address]

## 2017-03-15 NOTE — Care Management Note (Addendum)
Case Management Note  Patient Details  Name: Jacaden Forbush MRN: [id number] Date of Birth: Aug 04, 1975  Subjective/Objective:  Pt presented for chest pain and shortness of breath. CTA positive for Pulmonary Embolus. Plan for home on Lovenox Injections.                   Action/Plan: Benefits Check Completed: Pt is aware of co pay-[facility] does not have generic in stock. CM to call another store. No further needs from CM at this time.     S/W STACEY @ [facility] #    1. LOVENOX  120 MG X2 A TOTAL 20  COVER- YES  CO-PAY- $ 0,230.17  TIER- 4 DRUG  PRIOR APPROVAL- NO  DEDUCTIBLE : NOT MET   2. ENOXAPARIN 120 MG  COVER- YES  CO-PAY- $ 19.95  TIER- 2 DRUG  PRIOR APPROVAL- NO   PREFERRED PHARMACY : [facility]    Expected Discharge Date:                  Expected Discharge Plan:  Home/Self Care  In-House Referral:  NA  Discharge planning Services  CM Consult, Medication Assistance  Post Acute Care Choice:  NA Choice offered to:  NA  DME Arranged:  N/A DME Agency:  NA  HH Arranged:  NA HH Agency:  NA  Status of Service:  Completed, signed off  If discussed at Long Length of Stay Meetings, dates discussed:    Additional Comments:  Bethena Roys, RN 03/15/2017, 11:50 AM

## 2017-03-15 NOTE — Plan of Care (Signed)
Problem: Education: Goal: Knowledge of [facility] Education information/materials will improve Outcome: Completed/Met Date Met: 03/15/17 Pt and wife were able to explain steps of administering SQ lovenox. Both pt and wife administered lovenox successfully into pt's abdomen.

## 2017-03-15 NOTE — Evaluation (Signed)
Physical Therapy Evaluation Patient Details Name: [name] MRN: [id number] DOB: 05-21-1976 Today's Date: 03/15/2017   History of Present Illness  Pt adm with PE. Pt with recent gastric sleeve resection last week in [place]. PMH - morbid obesity  Clinical Impression  Pt mobilizing well. Pt appears apprehensive about moving and the pain he has. Reassured pt this is normal and encouraged him to continue to build activity once he returns home. Will follow acutely but does not need PT at DC.    Follow Up Recommendations No PT follow up    Equipment Recommendations  None recommended by PT    Recommendations for Other Services       Precautions / Restrictions Precautions Precautions: None Restrictions Weight Bearing Restrictions: No      Mobility  Bed Mobility Overal bed mobility: Modified Independent             General bed mobility comments: Incr time  Transfers Overall transfer level: Modified independent Equipment used: None             General transfer comment: Incr time  Ambulation/Gait Ambulation/Gait assistance: Supervision Ambulation Distance (Feet): 240 Feet Assistive device: None Gait Pattern/deviations: Step-through pattern;Decreased stride length;Wide base of support Gait velocity: decr Gait velocity interpretation: Below normal speed for age/gender General Gait Details: Gait tentative but no instability. RHR 98 HR with amb 134. SpO2 >92% on RA throughout gait  Stairs            Wheelchair Mobility    Modified Rankin (Stroke Patients Only)       Balance Overall balance assessment: No apparent balance deficits (not formally assessed)                                           Pertinent Vitals/Pain Pain Assessment: Faces Faces Pain Scale: Hurts little more Pain Location: rt chest/flank Pain Descriptors / Indicators: Grimacing Pain Intervention(s): Limited activity within patient's tolerance    Home Living  Family/patient expects to be discharged to:: Private residence Living Arrangements: Spouse/significant other             Home Equipment: None      Prior Function Level of Independence: Independent               Hand Dominance        Extremity/Trunk Assessment   Upper Extremity Assessment Upper Extremity Assessment: Overall WFL for tasks assessed    Lower Extremity Assessment Lower Extremity Assessment: Overall WFL for tasks assessed       Communication      Cognition Arousal/Alertness: Awake/alert Behavior During Therapy: WFL for tasks assessed/performed Overall Cognitive Status: Within Functional Limits for tasks assessed                                        General Comments      Exercises     Assessment/Plan    PT Assessment Patient needs continued PT services  PT Problem List Decreased activity tolerance;Decreased mobility       PT Treatment Interventions Gait training;Functional mobility training;Therapeutic activities;Patient/family education    PT Goals (Current goals can be found in the Care Plan section)  Acute Rehab PT Goals PT Goal Formulation: With patient Time For Goal Achievement: 03/22/17 Potential to Achieve Goals: Good    Frequency  Min 3X/week   Barriers to discharge        Co-evaluation               AM-PAC PT "6 Clicks" Daily Activity  Outcome Measure Difficulty turning over in bed (including adjusting bedclothes, sheets and blankets)?: None Difficulty moving from lying on back to sitting on the side of the bed? : None Difficulty sitting down on and standing up from a chair with arms (e.g., wheelchair, bedside commode, etc,.)?: None Help needed moving to and from a bed to chair (including a wheelchair)?: None Help needed walking in hospital room?: None Help needed climbing 3-5 steps with a railing? : None 6 Click Score: 24    End of Session   Activity Tolerance: Patient tolerated treatment  well Patient left: in chair;with call bell/phone within reach Nurse Communication: Mobility status PT Visit Diagnosis: Other abnormalities of gait and mobility (R26.89)    Time: 0932-6712 PT Time Calculation (min) (ACUTE ONLY): 21 min   Charges:   PT Evaluation $PT Eval Moderate Complexity: 1 Mod     PT G CodesMarland Kitchen        [facility] PT [place] 03/15/2017, 10:55 AM

## 2017-03-15 NOTE — Progress Notes (Signed)
Pt discharged to home with wife. Reviewed discharge instructions with patient and patient's wife. All questions were answered. Pt and family educated on lovenox and coumadin. IV removed successfully.

## 2017-07-05 ENCOUNTER — Other Ambulatory Visit (HOSPITAL_COMMUNITY): Payer: Self-pay | Admitting: Cardiology

## 2017-07-05 DIAGNOSIS — I2699 Other pulmonary embolism without acute cor pulmonale: Secondary | ICD-10-CM

## 2017-07-10 ENCOUNTER — Ambulatory Visit (HOSPITAL_COMMUNITY)
Admission: RE | Admit: 2017-07-10 | Discharge: 2017-07-10 | Disposition: A | Discharge status: Home / Self Care | Payer: BLUE CROSS/BLUE SHIELD | Source: Ambulatory Visit | Attending: Cardiology | Admitting: Cardiology

## 2017-07-10 DIAGNOSIS — I2699 Other pulmonary embolism without acute cor pulmonale: Secondary | ICD-10-CM | POA: Diagnosis present

## 2017-07-10 MED ORDER — IOPAMIDOL (ISOVUE-370) INJECTION 76%
INTRAVENOUS | Status: AC
  Administered 2017-07-10: 100 mL
  Filled 2017-07-10: qty 100

## 2017-11-03 ENCOUNTER — Other Ambulatory Visit: Payer: Self-pay

## 2017-11-03 ENCOUNTER — Emergency Department (HOSPITAL_BASED_OUTPATIENT_CLINIC_OR_DEPARTMENT_OTHER): Payer: BLUE CROSS/BLUE SHIELD

## 2017-11-03 ENCOUNTER — Emergency Department (HOSPITAL_BASED_OUTPATIENT_CLINIC_OR_DEPARTMENT_OTHER)
Admission: EM | Admit: 2017-11-03 | Discharge: 2017-11-03 | Disposition: A | Discharge status: Home / Self Care | Payer: BLUE CROSS/BLUE SHIELD | Attending: Emergency Medicine | Admitting: Emergency Medicine

## 2017-11-03 ENCOUNTER — Encounter (HOSPITAL_BASED_OUTPATIENT_CLINIC_OR_DEPARTMENT_OTHER): Payer: Self-pay

## 2017-11-03 DIAGNOSIS — Z79899 Other long term (current) drug therapy: Secondary | ICD-10-CM | POA: Insufficient documentation

## 2017-11-03 DIAGNOSIS — Z7901 Long term (current) use of anticoagulants: Secondary | ICD-10-CM | POA: Insufficient documentation

## 2017-11-03 DIAGNOSIS — L0291 Cutaneous abscess, unspecified: Secondary | ICD-10-CM

## 2017-11-03 DIAGNOSIS — L02415 Cutaneous abscess of right lower limb: Secondary | ICD-10-CM | POA: Insufficient documentation

## 2017-11-03 HISTORY — DX: Other pulmonary embolism without acute cor pulmonale: I26.99

## 2017-11-03 MED ORDER — LIDOCAINE-EPINEPHRINE 2 %-1:100000 IJ SOLN
20.0000 mL | Freq: Once | INTRAMUSCULAR | Status: DC
Start: 2017-11-03 — End: 2017-11-03
  Filled 2017-11-03: qty 20

## 2017-11-03 MED ORDER — DOXYCYCLINE HYCLATE 100 MG PO CAPS: 100.0000 mg | ORAL_CAPSULE | Freq: Two times a day (BID) | ORAL | 0 refills | Status: DC

## 2017-11-03 MED ORDER — DOXYCYCLINE HYCLATE 100 MG PO CAPS: 100.0000 mg | ORAL_CAPSULE | Freq: Two times a day (BID) | ORAL | 0 refills | Status: AC

## 2017-11-03 MED ORDER — LIDOCAINE-EPINEPHRINE (PF) 2 %-1:200000 IJ SOLN
INTRAMUSCULAR | Status: AC
  Filled 2017-11-03: qty 10

## 2017-11-03 NOTE — ED Triage Notes (Signed)
C/o pain to right LE x 3 weeks-was taken off blood thinners last month-was on r/t to PE

## 2017-11-03 NOTE — ED Provider Notes (Signed)
[facility] EMERGENCY DEPARTMENT Provider Note   CSN: [id number] Arrival date & time: 11/03/17  1708     History   Chief Complaint Chief Complaint  Patient presents with  . Leg Pain    HPI Laurier Jasperson is a 42 y.o. male.  HPI   Patient is a 42 year old male with history of obesity, eczema, PE, who presents emergency department today to be evaluated for right lower extremity swelling that began about 3 weeks ago.  States that about 2 weeks ago he began to have a boil to the area which has been painful.  Pain is constant and worsened about 3 days ago.  Reports some redness around the area.  Had chills last night which he took over-the-counter medication for unresolved symptoms.  Denies any documented fevers.  No chest pain or shortness of breath.  No numbness or weakness to the legs.  Has been ambulatory without difficulty. PT denies trauma to the leg.  Of note, patient has a history of pulmonary embolism.  He states this occurred after he had a surgery.  He was placed on Coumadin and recently stopped Coumadin about 4 weeks ago.  Past Medical History:  Diagnosis Date  . Eczema   . Pulmonary emboli [facility])     Patient Active Problem List   Diagnosis Date Noted  . Obesity, Class III, BMI 40-49.9 (morbid obesity) ([place]) 03/15/2017  . Acute pulmonary embolism ([place]) 03/13/2017  . Left ankle injury, initial encounter 02/20/2017  . Low back pain radiating to left leg 01/13/2016  . Low HDL (under 40) 01/13/2015  . Prediabetes 01/13/2015  . Fatigue 08/13/2014  . Hypersomnia 08/13/2014  . Snoring 08/13/2014    Past Surgical History:  Procedure Laterality Date  . KNEE SURGERY     right  . LAPAROSCOPIC GASTRIC BAND REMOVAL WITH LAPAROSCOPIC GASTRIC SLEEVE RESECTION    . WRIST FRACTURE SURGERY          Home Medications    Prior to Admission medications   Medication Sig Start Date End Date Taking? Authorizing Provider  cyclobenzaprine (FLEXERIL) 10 MG tablet Take 1  tablet (10 mg total) by mouth 2 (two) times daily as needed for muscle spasms. 01/16/17   Doristine Devoid, PA-C  diclofenac sodium (VOLTAREN) 1 % GEL Apply 4 g topically 4 (four) times daily. 03/15/17   Debbe Odea, MD  doxycycline (VIBRAMYCIN) 100 MG capsule Take 1 capsule (100 mg total) by mouth 2 (two) times daily for 7 days. 11/03/17 11/10/17  Eydan Chianese S, PA-C  enoxaparin (LOVENOX) 120 MG/0.8ML injection Inject 0.8 mLs (120 mg total) into the skin every 12 (twelve) hours. 03/15/17   Debbe Odea, MD  HYDROcodone-acetaminophen (NORCO/VICODIN) 5-325 MG tablet Take 1-2 tablets by mouth every 4 (four) hours as needed for moderate pain. 03/15/17 03/15/18  Debbe Odea, MD  Multiple Vitamins-Minerals (BARIATRIC MULTIVITAMINS/IRON PO) Take 1 tablet by mouth daily.    [provider]  omeprazole (PRILOSEC) 20 MG capsule Take 40 mg by mouth daily. Open capsule and pour into some type of food or liquid    [provider]  ondansetron (ZOFRAN-ODT) 4 MG disintegrating tablet Take 4 mg by mouth every 8 (eight) hours as needed for nausea or vomiting.    [provider]  protein supplement shake (PREMIER PROTEIN) LIQD Take 325 mLs (11 oz total) by mouth 3 (three) times daily between meals. 03/15/17   Debbe Odea, MD  simethicone (MYLICON) 40 NT/6.1WE drops Take 40 mg by mouth 4 (four) times  daily as needed for flatulence.    [provider]  warfarin (COUMADIN) 7.5 MG tablet Take 2 tablets (15 mg total) by mouth one time only at 6 PM. Take 15 mg tonight and 10 mg tomorrow and have INR checked on Friday 03/15/17   Debbe Odea, MD    Family History Family History  Problem Relation Age of Onset  . Hypertension Other     Social History Social History   Tobacco Use  . Smoking status: Never Smoker  . Smokeless tobacco: Never Used  Substance Use Topics  . Alcohol use: No  . Drug use: No     Allergies   Patient has no known allergies.   Review of  Systems Review of Systems  Constitutional: Positive for chills. Negative for fever.  HENT: Negative for congestion.   Eyes: Negative for visual disturbance.  Respiratory: Negative for shortness of breath.   Cardiovascular: Negative for chest pain.  Gastrointestinal: Negative for nausea and vomiting.  Genitourinary: Negative for flank pain.  Musculoskeletal: Negative for back pain.       RLE pain, swelling, redness  Skin: Positive for color change.  Neurological: Negative for dizziness, light-headedness and headaches.     Physical Exam Updated Vital Signs BP (!) 135/94 (BP Location: Right Arm)   Pulse 74   Temp 98.4 F (36.9 C) (Oral)   Resp 16   Ht 6\' 5"  (1.956 m)   Wt (!) 191.7 kg (422 lb 10 oz)   SpO2 100%   BMI 50.12 kg/m   Physical Exam  Constitutional: He is oriented to person, place, and time. He appears well-developed and well-nourished. No distress.  HENT:  Head: Normocephalic and atraumatic.  Eyes: Conjunctivae are normal.  Neck: Neck supple.  Cardiovascular: Normal rate, regular rhythm, normal heart sounds and intact distal pulses.  No murmur heard. Pulmonary/Chest: Effort normal and breath sounds normal. No stridor. No respiratory distress. He has no wheezes.  Abdominal: Soft. Bowel sounds are normal. There is no tenderness.  Morbidly obese  Musculoskeletal:  Trace edema to BLE, 1.5cm raised area of fluctuance to lateral aspect of lower part of RLE. TTP to the lower calf. Normal sensation and normal distal pulses. Brisk cap refill. FROM and normal strength.   Neurological: He is alert and oriented to person, place, and time.  Skin: Skin is warm and dry.  Psychiatric: He has a normal mood and affect.  Nursing note and vitals reviewed.   ED Treatments / Results  Labs (all labs ordered are listed, but only abnormal results are displayed) Labs Reviewed - No data to display  EKG None  Radiology US Venous Img Lower Right (dvt Study)  Result Date:  11/03/2017 CLINICAL DATA:  42 year old male with a painful bump in the distal lateral aspect of the right calf EXAM: RIGHT LOWER EXTREMITY VENOUS DOPPLER ULTRASOUND TECHNIQUE: Gray-scale sonography with graded compression, as well as color Doppler and duplex ultrasound were performed to evaluate the lower extremity deep venous systems from the level of the common femoral vein and including the common femoral, femoral, profunda femoral, popliteal and calf veins including the posterior tibial, peroneal and gastrocnemius veins when visible. The superficial great saphenous vein was also interrogated. Spectral Doppler was utilized to evaluate flow at rest and with distal augmentation maneuvers in the common femoral, femoral and popliteal veins. COMPARISON:  None. FINDINGS: Contralateral Common Femoral Vein: Respiratory phasicity is normal and symmetric with the symptomatic side. No evidence of thrombus. Normal compressibility. Common Femoral Vein: No evidence  of thrombus. Normal compressibility, respiratory phasicity and response to augmentation. Saphenofemoral Junction: No evidence of thrombus. Normal compressibility and flow on color Doppler imaging. Profunda Femoral Vein: No evidence of thrombus. Normal compressibility and flow on color Doppler imaging. Femoral Vein: No evidence of thrombus. Normal compressibility, respiratory phasicity and response to augmentation. Popliteal Vein: No evidence of thrombus. Normal compressibility, respiratory phasicity and response to augmentation. Calf Veins: No evidence of thrombus. Normal compressibility and flow on color Doppler imaging. Superficial Great Saphenous Vein: No evidence of thrombus. Normal compressibility. Venous Reflux:  None. Other Findings: Patent compressible superficial venous varicosities are present in the soft tissues of the right calf. In the region of the palpable lump, there is an area of heterogeneous echogenicity in the superficial subcutaneous fat. No  discrete fluid collection or mass. No significant hypervascularity. IMPRESSION: 1. No evidence of deep venous thrombosis. 2. Positive for superficial venous varicosities. Does the patient have a clinical history of superficial venous insufficiency? 3. In the region of clinical concern, there is a irregular and heterogeneous appearance of the superficial subcutaneous fat without a discrete fluid collection or mass. Differential considerations include fat necrosis if there is a recent prior history of trauma, focal cellulitis, and possibly soft tissue contusion. Electronically Signed   By: Jacqulynn Cadet M.D.   On: 11/03/2017 19:14    Procedures .Marland KitchenIncision and Drainage Date/Time: 11/03/2017 8:22 PM Performed by: Rodney Booze, PA-C Authorized by: Rodney Booze, PA-C   Consent:    Consent obtained:  Verbal   Consent given by:  Patient   Risks discussed:  Bleeding and incomplete drainage   Alternatives discussed:  No treatment Location:    Type:  Abscess   Size:  1.5cm   Location:  Lower extremity   Lower extremity location:  Leg   Leg location:  R lower leg Pre-procedure details:    Skin preparation:  Betadine Anesthesia (see MAR for exact dosages):    Anesthesia method:  Local infiltration   Local anesthetic:  Lidocaine 2% WITH epi Procedure type:    Complexity:  Simple Procedure details:    Incision types:  Stab incision   Scalpel blade:  11   Drainage:  Bloody and purulent   Drainage amount:  Scant   Wound treatment:  Wound left open   Packing materials:  None Post-procedure details:    Patient tolerance of procedure:  Tolerated well, no immediate complications   (including critical care time)  Medications Ordered in ED Medications  lidocaine-EPINEPHrine (XYLOCAINE W/EPI) 2 %-1:100000 (with pres) injection 20 mL (has no administration in time range)  lidocaine-EPINEPHrine (XYLOCAINE W/EPI) 2 %-1:200000 (PF) injection (has no administration in time range)      Initial Impression / Assessment and Plan / ED Course  I have reviewed the triage vital signs and the nursing notes.  Pertinent labs & imaging results that were available during my care of the patient were reviewed by me and considered in my medical decision making (see chart for details).     Final Clinical Impressions(s) / ED Diagnoses   Final diagnoses:  Abscess   Pt presenting with RLE swelling and pain the has been ongoing for 3 weeks but worsened 3 days ago. Vital signs stable, afebrile. Recently stopped coumadin after dx with PE after surgery. No CP, SOB. States sxs not consistent with prior PE sxs. Has area of fluctuance that is raised and consistent with an abscess. Will obtain RLE [place] to r/o DVT given h/o VTE. LE [place] negative for  DVT.  Ultrasound notes irregular heterogeneous appearance of superficial subcutaneous fat without discrete fluid collection or mass.  May indicate fat necrosis, cellulitis or soft tissue contusion.  Suspect in this patient's case that this is due to his cellulitis.  He has an abscess to the right lower extremity with surrounding tenderness and warmth. Abscess amenable to I&D.  Scant amount of blood and purulent drainage noted.  Wound not deep enough to require packing.  Border was drawn around area of cellulitis.  Patient discharged home with doxycycline and encouraged to do warm soaks at home and follow-up in 48 hours for a wound recheck.  ED Discharge Orders        Ordered    doxycycline (VIBRAMYCIN) 100 MG capsule  2 times daily     11/03/17 2026       Rodney Booze, PA-C 11/03/17 2027    Hayden Rasmussen, MD 11/04/17 1241

## 2017-11-03 NOTE — Discharge Instructions (Signed)
You were given a prescription for antibiotics. Please take the antibiotic prescription fully.   I have prescribed a new medication for you today. It is important that when you pick the prescription up you discuss the potential interactions of this medication with other medications you are taking, including over the counter medications, with the pharmacists.   This new medication has potential side effects. Be sure to contact your primary care provider or return to the emergency department if you are experiencing new symptoms that you are unable to tolerate after starting the medication. You need to receive medical evaluation immediately if you start to experience blistering of the skin, rash, swelling, or difficulty breathing as these signs could indicate a more serious medication side effect.   Please follow-up with your regular doctor or in the emergency department in 48 hours for a wound recheck. Please return to the emergency room sooner if you experience any new or worsening symptoms or any symptoms that indicate worsening infection such as fevers, increased redness/swelling/pain, warmth, or drainage from the affected area.

## 2017-11-05 ENCOUNTER — Emergency Department (HOSPITAL_BASED_OUTPATIENT_CLINIC_OR_DEPARTMENT_OTHER)
Admission: EM | Admit: 2017-11-05 | Discharge: 2017-11-05 | Disposition: A | Discharge status: Home / Self Care | Payer: BLUE CROSS/BLUE SHIELD | Attending: Emergency Medicine | Admitting: Emergency Medicine

## 2017-11-05 ENCOUNTER — Other Ambulatory Visit: Payer: Self-pay

## 2017-11-05 ENCOUNTER — Encounter (HOSPITAL_BASED_OUTPATIENT_CLINIC_OR_DEPARTMENT_OTHER): Payer: Self-pay | Admitting: Emergency Medicine

## 2017-11-05 DIAGNOSIS — L0390 Cellulitis, unspecified: Secondary | ICD-10-CM

## 2017-11-05 DIAGNOSIS — Z5189 Encounter for other specified aftercare: Secondary | ICD-10-CM

## 2017-11-05 DIAGNOSIS — Z79899 Other long term (current) drug therapy: Secondary | ICD-10-CM | POA: Insufficient documentation

## 2017-11-05 DIAGNOSIS — L03115 Cellulitis of right lower limb: Secondary | ICD-10-CM | POA: Insufficient documentation

## 2017-11-05 NOTE — Discharge Instructions (Signed)
Continue taking the antibiotic that you were given at your last visit.  Please continue applying warm compresses and completing warm soaks at home to help with fluid drainage.  Please follow-up with your primary care doctor this week for reevaluation. Please return to the emergency room immediately if you experience any new or worsening symptoms or any symptoms that indicate worsening infection such as fevers, increased redness/swelling/pain, warmth, or drainage from the affected area.

## 2017-11-05 NOTE — ED Provider Notes (Signed)
[facility] EMERGENCY DEPARTMENT Provider Note   CSN: [id number] Arrival date & time: 11/05/17  1213     History   Chief Complaint Chief Complaint  Patient presents with  . Wound Check    HPI [name] is a 42 y.o. male.  HPI   Patient is a 42 year old male with history of obesity, PE, etc. who presents emergency department for a wound recheck.  He was seen in the emergency department by myself 2 days ago.  At that time diagnosed with abscess that was incised and drained scant amount of drainage.  He has been taking doxycycline as advised.  Has been doing warm soaks at home.  Has noted purulent drainage over the last 2 days.  Denies any fevers.  Has had some swelling and redness to the area.  Also has pain is constant and worse with palpation.  Denies any other exacerbating or alleviating factors.  Past Medical History:  Diagnosis Date  . Eczema   . Pulmonary emboli [facility])     Patient Active Problem List   Diagnosis Date Noted  . Obesity, Class III, BMI 40-49.9 (morbid obesity) ([place]) 03/15/2017  . Acute pulmonary embolism ([place]) 03/13/2017  . Left ankle injury, initial encounter 02/20/2017  . Low back pain radiating to left leg 01/13/2016  . Low HDL (under 40) 01/13/2015  . Prediabetes 01/13/2015  . Fatigue 08/13/2014  . Hypersomnia 08/13/2014  . Snoring 08/13/2014    Past Surgical History:  Procedure Laterality Date  . KNEE SURGERY     right  . LAPAROSCOPIC GASTRIC BAND REMOVAL WITH LAPAROSCOPIC GASTRIC SLEEVE RESECTION    . WRIST FRACTURE SURGERY          Home Medications    Prior to Admission medications   Medication Sig Start Date End Date Taking? Authorizing Provider  cyclobenzaprine (FLEXERIL) 10 MG tablet Take 1 tablet (10 mg total) by mouth 2 (two) times daily as needed for muscle spasms. 01/16/17   Doristine Devoid, PA-C  diclofenac sodium (VOLTAREN) 1 % GEL Apply 4 g topically 4 (four) times daily. 03/15/17   Debbe Odea, MD    doxycycline (VIBRAMYCIN) 100 MG capsule Take 1 capsule (100 mg total) by mouth 2 (two) times daily for 7 days. 11/03/17 11/10/17  Diannia Hogenson S, PA-C  enoxaparin (LOVENOX) 120 MG/0.8ML injection Inject 0.8 mLs (120 mg total) into the skin every 12 (twelve) hours. 03/15/17   Debbe Odea, MD  HYDROcodone-acetaminophen (NORCO/VICODIN) 5-325 MG tablet Take 1-2 tablets by mouth every 4 (four) hours as needed for moderate pain. 03/15/17 03/15/18  Debbe Odea, MD  Multiple Vitamins-Minerals (BARIATRIC MULTIVITAMINS/IRON PO) Take 1 tablet by mouth daily.    [provider]  omeprazole (PRILOSEC) 20 MG capsule Take 40 mg by mouth daily. Open capsule and pour into some type of food or liquid    [provider]  ondansetron (ZOFRAN-ODT) 4 MG disintegrating tablet Take 4 mg by mouth every 8 (eight) hours as needed for nausea or vomiting.    [provider]  protein supplement shake (PREMIER PROTEIN) LIQD Take 325 mLs (11 oz total) by mouth 3 (three) times daily between meals. 03/15/17   Debbe Odea, MD  simethicone (MYLICON) 40 ZJ/6.9CV drops Take 40 mg by mouth 4 (four) times daily as needed for flatulence.    [provider]  warfarin (COUMADIN) 7.5 MG tablet Take 2 tablets (15 mg total) by mouth one time only at 6 PM. Take 15 mg tonight and 10 mg tomorrow and  have INR checked on Friday 03/15/17   Debbe Odea, MD    Family History Family History  Problem Relation Age of Onset  . Hypertension Other     Social History Social History   Tobacco Use  . Smoking status: Never Smoker  . Smokeless tobacco: Never Used  Substance Use Topics  . Alcohol use: No  . Drug use: No     Allergies   Patient has no known allergies.   Review of Systems Review of Systems  Constitutional: Negative for fever.  Skin: Positive for color change and wound.  Neurological: Negative for weakness and numbness.     Physical Exam Updated Vital Signs BP (!) 142/84 (BP  Location: Left Arm)   Pulse 71   Temp 98.7 F (37.1 C) (Oral)   Resp 18   Ht 6\' 5"  (1.956 m)   Wt (!) 191.4 kg (422 lb)   SpO2 100%   BMI 50.04 kg/m   Physical Exam  Constitutional: He is oriented to person, place, and time. He appears well-developed and well-nourished. No distress.  Eyes: Conjunctivae are normal.  Cardiovascular: Normal rate.  Pulmonary/Chest: Effort normal.  Musculoskeletal:  RLE with swelling, erythema, and warmth to right lateral shin and ankle. No drainage noted. Area is TTP.   Neurological: He is alert and oriented to person, place, and time.  Skin: Skin is warm and dry.   ED Treatments / Results  Labs (all labs ordered are listed, but only abnormal results are displayed) Labs Reviewed - No data to display  EKG None  Radiology US Venous Img Lower Right (dvt Study)  Result Date: 11/03/2017 CLINICAL DATA:  42 year old male with a painful bump in the distal lateral aspect of the right calf EXAM: RIGHT LOWER EXTREMITY VENOUS DOPPLER ULTRASOUND TECHNIQUE: Gray-scale sonography with graded compression, as well as color Doppler and duplex ultrasound were performed to evaluate the lower extremity deep venous systems from the level of the common femoral vein and including the common femoral, femoral, profunda femoral, popliteal and calf veins including the posterior tibial, peroneal and gastrocnemius veins when visible. The superficial great saphenous vein was also interrogated. Spectral Doppler was utilized to evaluate flow at rest and with distal augmentation maneuvers in the common femoral, femoral and popliteal veins. COMPARISON:  None. FINDINGS: Contralateral Common Femoral Vein: Respiratory phasicity is normal and symmetric with the symptomatic side. No evidence of thrombus. Normal compressibility. Common Femoral Vein: No evidence of thrombus. Normal compressibility, respiratory phasicity and response to augmentation. Saphenofemoral Junction: No evidence of  thrombus. Normal compressibility and flow on color Doppler imaging. Profunda Femoral Vein: No evidence of thrombus. Normal compressibility and flow on color Doppler imaging. Femoral Vein: No evidence of thrombus. Normal compressibility, respiratory phasicity and response to augmentation. Popliteal Vein: No evidence of thrombus. Normal compressibility, respiratory phasicity and response to augmentation. Calf Veins: No evidence of thrombus. Normal compressibility and flow on color Doppler imaging. Superficial Great Saphenous Vein: No evidence of thrombus. Normal compressibility. Venous Reflux:  None. Other Findings: Patent compressible superficial venous varicosities are present in the soft tissues of the right calf. In the region of the palpable lump, there is an area of heterogeneous echogenicity in the superficial subcutaneous fat. No discrete fluid collection or mass. No significant hypervascularity. IMPRESSION: 1. No evidence of deep venous thrombosis. 2. Positive for superficial venous varicosities. Does the patient have a clinical history of superficial venous insufficiency? 3. In the region of clinical concern, there is a irregular and heterogeneous appearance of the  superficial subcutaneous fat without a discrete fluid collection or mass. Differential considerations include fat necrosis if there is a recent prior history of trauma, focal cellulitis, and possibly soft tissue contusion. Electronically Signed   By: Jacqulynn Cadet M.D.   On: 11/03/2017 19:14    Procedures Procedures (including critical care time)  Medications Ordered in ED Medications - No data to display   Initial Impression / Assessment and Plan / ED Course  I have reviewed the triage vital signs and the nursing notes.  Pertinent labs & imaging results that were available during my care of the patient were reviewed by me and considered in my medical decision making (see chart for details).     Final Clinical Impressions(s) /  ED Diagnoses   Final diagnoses:  Visit for wound check  Cellulitis, unspecified cellulitis site   Pt with abscess seen two days ago with I&D. Chart reviewed in EPIC. Pt has been doing a very good job of taking care of the site at home and has been taking medications as directed. The site has drainaed purulent drainage around the skin. The incision is open and without significant drainage today. Some surrounding cellulitis noted. No palpable fluctuance. Bedside US completed and no fluid collection was identified that would be amenable to repeat I&D.  Advised to continue abx and warm soaks. Advised to f/u with PCP this week and return if worse. All questions answered and pt in agreement with plan.    ED Discharge Orders    None       Bishop Dublin 11/05/17 1325    Dorie Rank, MD 11/06/17 [phone]

## 2017-11-05 NOTE — ED Triage Notes (Signed)
Patient states that he had an I and D 2 days ago to his left shin was told to follow up today

## 2018-03-09 ENCOUNTER — Ambulatory Visit (INDEPENDENT_AMBULATORY_CARE_PROVIDER_SITE_OTHER): Payer: Self-pay | Admitting: Family Medicine

## 2018-03-09 ENCOUNTER — Encounter: Payer: Self-pay | Admitting: Family Medicine

## 2018-03-09 VITALS — BP 142/91 | HR 58 | Ht 77.0 in | Wt >= 6400 oz

## 2018-03-09 DIAGNOSIS — M79605 Pain in left leg: Secondary | ICD-10-CM

## 2018-03-09 DIAGNOSIS — M545 Low back pain, unspecified: Secondary | ICD-10-CM

## 2018-03-09 DIAGNOSIS — M25552 Pain in left hip: Secondary | ICD-10-CM

## 2018-03-09 MED ORDER — MELOXICAM 15 MG PO TABS: 15.0000 mg | ORAL_TABLET | Freq: Every day | ORAL | 2 refills | Status: DC

## 2018-03-09 MED ORDER — PREDNISONE 10 MG PO TABS: ORAL_TABLET | ORAL | 0 refills | Status: DC

## 2018-03-09 NOTE — Patient Instructions (Signed)
You have lumbar radiculopathy (a pinched nerve in your low back), SI joint dysfunction, and mild arthritis of your hip. Ok to take tylenol for baseline pain relief (1-2 extra strength tabs 3x/day) A prednisone dose pack is the best option for immediate relief and may be prescribed. Day after finishing prednisone start meloxicam with food for pain and inflammation. Stay as active as possible. Physical therapy has been shown to be helpful as well - start this. Do home exercises on days you don't go to therapy. Follow up with me in 5-6 weeks.

## 2018-03-12 ENCOUNTER — Encounter: Payer: Self-pay | Admitting: Family Medicine

## 2018-03-12 NOTE — Progress Notes (Signed)
PCP: Secundino Ginger, PA-C  Subjective:   HPI: Patient is a 42 y.o. male here for Left hip/low back pain.  Patient reports for about 6-7 months he's had slowly worsening pain in left lateral/posterior hip area. Pain up to 8/10 and sharp, worse with sitting for prolonged periods and lying down. Pain radiates into his groin and anterior knee. Occasional back pain as well. Tingling down to bottom of his left foot. No bowel/bladder dysfunction. No skin changes.  Past Medical History:  Diagnosis Date  . Eczema   . Pulmonary emboli [facility])     Current Outpatient Medications on File Prior to Visit  Medication Sig Dispense Refill  . CLOBETASOL PROPIONATE E 0.05 % emollient cream APPLY 1 APPLICATION TOPICALLY TWICE DAILY AS NEEDED  0  . diclofenac sodium (VOLTAREN) 1 % GEL Apply 4 g topically 4 (four) times daily.    . Multiple Vitamins-Minerals (BARIATRIC MULTIVITAMINS/IRON PO) Take 1 tablet by mouth daily.    Marland Kitchen omeprazole (PRILOSEC) 20 MG capsule Take 40 mg by mouth daily. Open capsule and pour into some type of food or liquid    . protein supplement shake (PREMIER PROTEIN) LIQD Take 325 mLs (11 oz total) by mouth 3 (three) times daily between meals.  0  . simethicone (MYLICON) 40 QI/3.4VQ drops Take 40 mg by mouth 4 (four) times daily as needed for flatulence.     No current facility-administered medications on file prior to visit.     Past Surgical History:  Procedure Laterality Date  . KNEE SURGERY     right  . LAPAROSCOPIC GASTRIC BAND REMOVAL WITH LAPAROSCOPIC GASTRIC SLEEVE RESECTION    . WRIST FRACTURE SURGERY      No Known Allergies  Social History   Socioeconomic History  . Marital status: Married    Spouse name: Not on file  . Number of children: Not on file  . Years of education: Not on file  . Highest education level: Not on file  Occupational History  . Not on file  Social Needs  . Financial resource strain: Not on file  . Food insecurity:    Worry: Not on  file    Inability: Not on file  . Transportation needs:    Medical: Not on file    Non-medical: Not on file  Tobacco Use  . Smoking status: Never Smoker  . Smokeless tobacco: Never Used  Substance and Sexual Activity  . Alcohol use: No  . Drug use: No  . Sexual activity: Not on file  Lifestyle  . Physical activity:    Days per week: Not on file    Minutes per session: Not on file  . Stress: Not on file  Relationships  . Social connections:    Talks on phone: Not on file    Gets together: Not on file    Attends religious service: Not on file    Active member of club or organization: Not on file    Attends meetings of clubs or organizations: Not on file    Relationship status: Not on file  . Intimate partner violence:    Fear of current or ex partner: Not on file    Emotionally abused: Not on file    Physically abused: Not on file    Forced sexual activity: Not on file  Other Topics Concern  . Not on file  Social History Narrative  . Not on file    Family History  Problem Relation Age of Onset  . Hypertension  Other     BP (!) 142/91   Pulse (!) 58   Ht 6\' 5"  (1.956 m)   Wt (!) 405 lb (183.7 kg)   BMI 48.03 kg/m   Review of Systems: See HPI above.     Objective:  Physical Exam:  Gen: NAD, comfortable in exam room  Back: No gross deformity, scoliosis. No paraspinal TTP .  No midline or bony TTP. FROM with tightness on flexion. Strength LEs 5/5 all muscle groups.   Trace MSRs in patellar and achilles tendons, equal bilaterally. Negative SLRs. Sensation intact to light touch bilaterally.  Left hip: No deformity. Mild limitation IR with some pain.  5/5 strength all motions. TTP over SI joint. NVI distally. Negative logroll bilateral hips Mild pain with fabers, negative piriformis.   Assessment & Plan:  1. 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  Discussed options - will start physical therapy, prednisone dose pack then transition to meloxicam.  F/u in 5-6 weeks.

## 2018-04-13 ENCOUNTER — Ambulatory Visit: Payer: Self-pay | Admitting: Family Medicine

## 2018-05-09 ENCOUNTER — Other Ambulatory Visit: Payer: Self-pay | Admitting: Cardiology

## 2018-05-09 DIAGNOSIS — R519 Headache, unspecified: Secondary | ICD-10-CM

## 2018-05-09 DIAGNOSIS — R51 Headache: Principal | ICD-10-CM

## 2018-05-11 ENCOUNTER — Ambulatory Visit
Admission: RE | Admit: 2018-05-11 | Discharge: 2018-05-11 | Disposition: A | Discharge status: Home / Self Care | Payer: BLUE CROSS/BLUE SHIELD | Source: Ambulatory Visit | Attending: Cardiology | Admitting: Cardiology

## 2018-05-11 DIAGNOSIS — G8929 Other chronic pain: Secondary | ICD-10-CM

## 2018-05-11 DIAGNOSIS — R51 Headache: Principal | ICD-10-CM

## 2018-05-11 DIAGNOSIS — R519 Headache, unspecified: Secondary | ICD-10-CM

## 2018-08-16 ENCOUNTER — Other Ambulatory Visit: Payer: Self-pay | Admitting: Orthopedic Surgery

## 2018-08-16 DIAGNOSIS — G8929 Other chronic pain: Secondary | ICD-10-CM

## 2018-08-16 DIAGNOSIS — M5416 Radiculopathy, lumbar region: Secondary | ICD-10-CM

## 2018-08-16 DIAGNOSIS — M546 Pain in thoracic spine: Secondary | ICD-10-CM

## 2018-08-20 ENCOUNTER — Other Ambulatory Visit: Payer: Self-pay

## 2018-08-20 ENCOUNTER — Ambulatory Visit
Admission: RE | Admit: 2018-08-20 | Discharge: 2018-08-20 | Disposition: A | Discharge status: Home / Self Care | Payer: BLUE CROSS/BLUE SHIELD | Source: Ambulatory Visit | Attending: Orthopedic Surgery | Admitting: Orthopedic Surgery

## 2018-08-20 DIAGNOSIS — M546 Pain in thoracic spine: Secondary | ICD-10-CM

## 2018-08-20 DIAGNOSIS — M5416 Radiculopathy, lumbar region: Secondary | ICD-10-CM

## 2018-08-20 DIAGNOSIS — G8929 Other chronic pain: Secondary | ICD-10-CM

## 2018-08-27 ENCOUNTER — Other Ambulatory Visit: Payer: Self-pay | Admitting: Orthopedic Surgery

## 2018-08-27 DIAGNOSIS — M533 Sacrococcygeal disorders, not elsewhere classified: Secondary | ICD-10-CM

## 2018-10-26 ENCOUNTER — Ambulatory Visit
Admission: RE | Admit: 2018-10-26 | Discharge: 2018-10-26 | Disposition: A | Discharge status: Home / Self Care | Payer: BLUE CROSS/BLUE SHIELD | Source: Ambulatory Visit | Attending: Orthopedic Surgery | Admitting: Orthopedic Surgery

## 2018-10-26 ENCOUNTER — Other Ambulatory Visit: Payer: Self-pay

## 2018-10-26 DIAGNOSIS — M533 Sacrococcygeal disorders, not elsewhere classified: Secondary | ICD-10-CM

## 2018-10-26 MED ORDER — METHYLPREDNISOLONE ACETATE 40 MG/ML INJ SUSP (RADIOLOG: 120.0000 mg | Freq: Once | INTRAMUSCULAR | Status: DC

## 2018-11-08 ENCOUNTER — Other Ambulatory Visit: Payer: Self-pay | Admitting: Orthopedic Surgery

## 2018-11-08 DIAGNOSIS — M533 Sacrococcygeal disorders, not elsewhere classified: Secondary | ICD-10-CM

## 2018-11-27 ENCOUNTER — Ambulatory Visit
Admission: RE | Admit: 2018-11-27 | Discharge: 2018-11-27 | Disposition: A | Discharge status: Home / Self Care | Payer: BC Managed Care – PPO | Source: Ambulatory Visit | Attending: Orthopedic Surgery | Admitting: Orthopedic Surgery

## 2018-11-27 ENCOUNTER — Other Ambulatory Visit: Payer: Self-pay

## 2018-11-27 DIAGNOSIS — M533 Sacrococcygeal disorders, not elsewhere classified: Secondary | ICD-10-CM

## 2018-12-02 ENCOUNTER — Emergency Department (HOSPITAL_BASED_OUTPATIENT_CLINIC_OR_DEPARTMENT_OTHER)
Admission: EM | Admit: 2018-12-02 | Discharge: 2018-12-02 | Disposition: A | Discharge status: Home / Self Care | Payer: BC Managed Care – PPO | Attending: Emergency Medicine | Admitting: Emergency Medicine

## 2018-12-02 ENCOUNTER — Other Ambulatory Visit: Payer: Self-pay

## 2018-12-02 ENCOUNTER — Encounter (HOSPITAL_BASED_OUTPATIENT_CLINIC_OR_DEPARTMENT_OTHER): Payer: Self-pay | Admitting: Emergency Medicine

## 2018-12-02 ENCOUNTER — Emergency Department (HOSPITAL_BASED_OUTPATIENT_CLINIC_OR_DEPARTMENT_OTHER): Payer: BC Managed Care – PPO

## 2018-12-02 DIAGNOSIS — M25532 Pain in left wrist: Secondary | ICD-10-CM | POA: Insufficient documentation

## 2018-12-02 DIAGNOSIS — Z79899 Other long term (current) drug therapy: Secondary | ICD-10-CM | POA: Diagnosis not present

## 2018-12-02 DIAGNOSIS — M25531 Pain in right wrist: Secondary | ICD-10-CM

## 2018-12-02 NOTE — Discharge Instructions (Addendum)
Please call the sports medicine physician for follow-up.    Splint for comfort  Please take ibuprofen and Tylenol for discomfort.  Ice as needed  Keep your wrist elevated

## 2018-12-02 NOTE — ED Triage Notes (Signed)
Pt here with right wrist pain after picking up his wife yesterday and felt a pull and now has limited movement.

## 2018-12-02 NOTE — ED Provider Notes (Signed)
[facility] EMERGENCY DEPARTMENT Provider Note   CSN: [id number] Arrival date & time: 12/02/18  9147     History   Chief Complaint Chief Complaint  Patient presents with  . Wrist Pain    HPI [name] is a 43 y.o. male.     HPI 43 year old male who reports injuring his right wrist yesterday when picking up his wife.  He states he has limited both ulnar and radial deviation of his right wrist secondary to pain.  No fevers or chills.  This happened acutely after the traumatic event.  No other complaints.  Pain is mild to moderate in severity.  Worse with movement   Past Medical History:  Diagnosis Date  . Eczema   . Pulmonary emboli [facility])     Patient Active Problem List   Diagnosis Date Noted  . Obesity, Class III, BMI 40-49.9 (morbid obesity) ([place]) 03/15/2017  . Acute pulmonary embolism ([place]) 03/13/2017  . Left ankle injury, initial encounter 02/20/2017  . Low back pain radiating to left leg 01/13/2016  . Low HDL (under 40) 01/13/2015  . Prediabetes 01/13/2015  . Fatigue 08/13/2014  . Hypersomnia 08/13/2014  . Snoring 08/13/2014    Past Surgical History:  Procedure Laterality Date  . KNEE SURGERY     right  . LAPAROSCOPIC GASTRIC BAND REMOVAL WITH LAPAROSCOPIC GASTRIC SLEEVE RESECTION    . WRIST FRACTURE SURGERY          Home Medications    Prior to Admission medications   Medication Sig Start Date End Date Taking? Authorizing Provider  Multiple Vitamins-Minerals (BARIATRIC MULTIVITAMINS/IRON PO) Take 1 tablet by mouth daily.   Yes [provider]  traMADol (ULTRAM) 50 MG tablet Take 50 mg by mouth every 6 (six) hours as needed.   Yes [provider]  CLOBETASOL PROPIONATE E 0.05 % emollient cream APPLY 1 APPLICATION TOPICALLY TWICE DAILY AS NEEDED 02/11/18   [provider]  diclofenac sodium (VOLTAREN) 1 % GEL Apply 4 g topically 4 (four) times daily. 03/15/17   Debbe Odea, MD  meloxicam (MOBIC) 15 MG tablet Take  1 tablet (15 mg total) by mouth daily. 03/09/18   Hudnall, Sharyn Lull, MD  omeprazole (PRILOSEC) 20 MG capsule Take 40 mg by mouth daily. Open capsule and pour into some type of food or liquid    [provider]  predniSONE (DELTASONE) 10 MG tablet 6 tabs po day 1, 5 tabs po day 2, 4 tabs po day 3, 3 tabs po day 4, 2 tabs po day 5, 1 tab po day 6 03/09/18   Hudnall, Sharyn Lull, MD  protein supplement shake (PREMIER PROTEIN) LIQD Take 325 mLs (11 oz total) by mouth 3 (three) times daily between meals. 03/15/17   Debbe Odea, MD  simethicone (MYLICON) 40 WG/9.5AO drops Take 40 mg by mouth 4 (four) times daily as needed for flatulence.    [provider]    Family History Family History  Problem Relation Age of Onset  . Hypertension Other     Social History Social History   Tobacco Use  . Smoking status: Never Smoker  . Smokeless tobacco: Never Used  Substance Use Topics  . Alcohol use: No  . Drug use: No     Allergies   Patient has no known allergies.   Review of Systems Review of Systems  All other systems reviewed and are negative.    Physical Exam Updated Vital Signs BP (!) 139/93 (BP Location: Left Arm)  Pulse 65   Temp 98.9 F (37.2 C) (Oral)   Resp 16   SpO2 100%   Physical Exam Vitals signs and nursing note reviewed.  Constitutional:      Appearance: He is well-developed.  HENT:     Head: Normocephalic.  Neck:     Musculoskeletal: Normal range of motion.  Pulmonary:     Effort: Pulmonary effort is normal.  Abdominal:     General: There is no distension.  Musculoskeletal: Normal range of motion.     Comments: No right radial pulse.  Moves all 5 fingers on his right hand.  Painful ulnar and radial deviation.  Able to passively range.  Neurological:     Mental Status: He is alert and oriented to person, place, and time.      ED Treatments / Results  Labs (all labs ordered are listed, but only abnormal results are displayed) Labs  Reviewed - No data to display  EKG None  Radiology Dg Wrist Complete Right  Result Date: 12/02/2018 CLINICAL DATA:  Lifting injury yesterday with ulnar side pain and swelling EXAM: RIGHT WRIST - COMPLETE 3+ VIEW COMPARISON:  None. FINDINGS: No evidence of fracture, dislocation or significant degenerative change. No other focal finding. IMPRESSION: Negative radiographs. Electronically Signed   By: Nelson Chimes M.D.   On: 12/02/2018 08:54    Procedures Procedures (including critical care time)  Medications Ordered in ED Medications - No data to display   Initial Impression / Assessment and Plan / ED Course  I have reviewed the triage vital signs and the nursing notes.  Pertinent labs & imaging results that were available during my care of the patient were reviewed by me and considered in my medical decision making (see chart for details).        X-ray of the right wrist demonstrates no acute osseous abnormalities.  Will treat conservatively with anti-inflammatories ice and a wrist splint.  He will need sports medicine follow-up.  Final Clinical Impressions(s) / ED Diagnoses   Final diagnoses:  Acute pain of right wrist    ED Discharge Orders    None       Jola Schmidt, MD 12/02/18 [phone]

## 2018-12-03 ENCOUNTER — Other Ambulatory Visit (HOSPITAL_COMMUNITY): Payer: Self-pay | Admitting: Orthopedic Surgery

## 2018-12-03 ENCOUNTER — Telehealth (HOSPITAL_COMMUNITY): Payer: Self-pay

## 2018-12-03 DIAGNOSIS — M533 Sacrococcygeal disorders, not elsewhere classified: Secondary | ICD-10-CM

## 2018-12-03 NOTE — Telephone Encounter (Signed)
Called to schedule lumbar injection, no answer, left vm. AW

## 2018-12-11 ENCOUNTER — Ambulatory Visit (HOSPITAL_COMMUNITY)
Admission: RE | Admit: 2018-12-11 | Discharge: 2018-12-11 | Disposition: A | Discharge status: Home / Self Care | Payer: BC Managed Care – PPO | Source: Ambulatory Visit | Attending: Orthopedic Surgery | Admitting: Orthopedic Surgery

## 2018-12-11 ENCOUNTER — Other Ambulatory Visit (HOSPITAL_COMMUNITY): Payer: Self-pay | Admitting: Orthopedic Surgery

## 2018-12-11 ENCOUNTER — Encounter (HOSPITAL_COMMUNITY): Payer: Self-pay | Admitting: Interventional Radiology

## 2018-12-11 ENCOUNTER — Other Ambulatory Visit: Payer: Self-pay

## 2018-12-11 DIAGNOSIS — G8929 Other chronic pain: Secondary | ICD-10-CM | POA: Insufficient documentation

## 2018-12-11 DIAGNOSIS — M533 Sacrococcygeal disorders, not elsewhere classified: Secondary | ICD-10-CM

## 2018-12-11 DIAGNOSIS — M545 Low back pain: Secondary | ICD-10-CM | POA: Insufficient documentation

## 2018-12-11 HISTORY — PX: IR EPIDUROGRAPHY: IMG2365

## 2018-12-11 MED ORDER — METHYLPREDNISOLONE ACETATE 80 MG/ML IJ SUSP
INTRAMUSCULAR | Status: AC
  Filled 2018-12-11: qty 1

## 2018-12-11 MED ORDER — IOPAMIDOL (ISOVUE-200) INJECTION 41%
INTRAVENOUS | Status: DC | PRN
  Administered 2018-12-11: 6 mL

## 2018-12-11 MED ORDER — STERILE WATER FOR INJECTION IJ SOLN
INTRAMUSCULAR | Status: DC | PRN
  Administered 2018-12-11: 3 mL

## 2018-12-11 MED ORDER — LIDOCAINE HCL 1 % IJ SOLN
INTRAMUSCULAR | Status: DC | PRN
  Administered 2018-12-11: 20 mL

## 2018-12-11 MED ORDER — METHYLPREDNISOLONE ACETATE 80 MG/ML IJ SUSP
INTRAMUSCULAR | Status: DC | PRN
  Administered 2018-12-11: 80 mg via INTRA_ARTICULAR

## 2018-12-11 MED ORDER — STERILE WATER FOR INJECTION IJ SOLN
INTRAMUSCULAR | Status: AC
  Filled 2018-12-11: qty 10

## 2018-12-11 MED ORDER — LIDOCAINE HCL (PF) 1 % IJ SOLN
INTRAMUSCULAR | Status: AC
  Filled 2018-12-11: qty 30

## 2018-12-11 MED ORDER — IOPAMIDOL (ISOVUE-M 200) INJECTION 41%
INTRAMUSCULAR | Status: AC
  Filled 2018-12-11: qty 10

## 2018-12-11 MED ORDER — ONDANSETRON HCL 4 MG/2ML IJ SOLN: 4.0000 mg | Freq: Four times a day (QID) | INTRAMUSCULAR | Status: DC | PRN

## 2018-12-11 MED ORDER — METHYLPREDNISOLONE ACETATE 40 MG/ML IJ SUSP
INTRAMUSCULAR | Status: AC
  Filled 2018-12-11: qty 1

## 2018-12-11 MED ORDER — LIDOCAINE HCL 1 % IJ SOLN
INTRAMUSCULAR | Status: AC
  Filled 2018-12-11: qty 20

## 2018-12-11 MED ORDER — METHYLPREDNISOLONE ACETATE 40 MG/ML IJ SUSP
INTRAMUSCULAR | Status: DC | PRN
  Administered 2018-12-11: 40 mg via INTRAMUSCULAR

## 2018-12-11 MED ORDER — HYDROCODONE-ACETAMINOPHEN 5-325 MG PO TABS: 1.0000 | ORAL_TABLET | ORAL | Status: DC | PRN

## 2018-12-11 NOTE — Procedures (Signed)
  Procedure: Left L3/4 4/5 5/S1 facet injections 120mg  depomedrol EBL:   minimal Complications:  none immediate  See full dictation in [facility].  Dillard Cannon MD Main # [phone] Pager  [phone]

## 2019-02-22 ENCOUNTER — Other Ambulatory Visit: Payer: Self-pay | Admitting: Orthopedic Surgery

## 2019-02-22 DIAGNOSIS — M542 Cervicalgia: Secondary | ICD-10-CM

## 2019-02-28 ENCOUNTER — Ambulatory Visit
Admission: RE | Admit: 2019-02-28 | Discharge: 2019-02-28 | Disposition: A | Discharge status: Home / Self Care | Payer: BLUE CROSS/BLUE SHIELD | Source: Ambulatory Visit | Attending: Orthopedic Surgery | Admitting: Orthopedic Surgery

## 2019-02-28 ENCOUNTER — Other Ambulatory Visit: Payer: Self-pay

## 2019-02-28 DIAGNOSIS — M542 Cervicalgia: Secondary | ICD-10-CM

## 2019-03-07 ENCOUNTER — Other Ambulatory Visit: Payer: Self-pay

## 2019-03-07 ENCOUNTER — Ambulatory Visit: Payer: BC Managed Care – PPO | Admitting: Neurology

## 2019-03-07 ENCOUNTER — Encounter: Payer: Self-pay | Admitting: Neurology

## 2019-03-07 ENCOUNTER — Telehealth: Payer: Self-pay | Admitting: Neurology

## 2019-03-07 VITALS — BP 126/88 | HR 72 | Ht 77.0 in | Wt >= 6400 oz

## 2019-03-07 DIAGNOSIS — R202 Paresthesia of skin: Secondary | ICD-10-CM | POA: Diagnosis not present

## 2019-03-07 DIAGNOSIS — H538 Other visual disturbances: Secondary | ICD-10-CM | POA: Diagnosis not present

## 2019-03-07 DIAGNOSIS — R200 Anesthesia of skin: Secondary | ICD-10-CM

## 2019-03-07 NOTE — Patient Instructions (Signed)
  Your neurological exam is quite reassuring today, no obvious abnormality on my examination.  We will do blood work to investigate your numbness and tingling affecting particularly your upper and lower extremities. We will do an EMG and nerve conduction velocity test, which is an electrical nerve and muscle test, which we will schedule. We will call you with the results. Given your history of blurry vision and facial tingling, we will do a brain scan, called MRI and call you with the test results. We will have to schedule you for this on a separate date. This test requires authorization from your insurance, and we will take care of the insurance process.

## 2019-03-07 NOTE — Telephone Encounter (Signed)
[employer] Auth: [id number] (exp. 03/07/19 to 09/02/19) order sent to GI. They will reach out to the patient to schedule.

## 2019-03-07 NOTE — Progress Notes (Signed)
Subjective:    Patient ID: [name] is a 43 y.o. male.  HPI     Antonio Age, MD, PhD [facility] [address], [address] [place], [place] [address]  Dear Dr. Lynann Bologna, I saw your patient, [name], upon your kind request to my neurologic clinic today for initial consultation of his neck and back pain and paresthesias.  The patient is unaccompanied today.  As you know, [name] is a 43 year old right-handed gentleman with an underlying medical history of pulmonary embolus, eczema, morbid obesity with a BMI of over 50 with status post sleeve gastrectomy on 03/06/2017 through [place], who reports a longer standing history of low back pain.  He has had neck pain.  I reviewed your office records, most recent office note from 03/04/2019.  He had a recent cervical spine MRI without contrast on 02/28/2019 and I reviewed the results: IMPRESSION: No finding to explain the patient's symptoms. Mild cervical spondylosis without central canal or foraminal narrowing.   He had a lumbar spine MRI without contrast on 08/20/2018 and I reviewed the results: IMPRESSION: No disc protrusion or spinal stenosis. With regard to the clinical question, there is no compressive LEFT-sided abnormality.   Asymmetric edema, arthropathy, and periarticular inflammatory change involving the RIGHT L4-5 facet. This is of uncertain clinical significance given LEFT-sided pain.   Increased body habitus without features of epidural lipomatosis.   Suspect solid arthrodesis across the RIGHT L5-S1 facet.  Prior to that he reportedly had a lumbar spine MRI and [place] on 01/11/2016.  He is status post lumbar spine facet injections under Dr. Mina Marble with some relief of his back pain.  He reports numbness and tingling in his upper extremities.  He has had these symptoms for years but they have worsened over the past 6 months. He was tested for carpal tunnel syndrome many years ago.  He  had EMG nerve conduction testing for this as I understand.  He had a sleep study before his weight loss surgery and was told he did not need CPAP therapy.  Sleep study testing was in 2018 by patient's report.  He reports that he talk to you about his symptoms and that he was told he could have symptoms from [place].  He is worried about having MS. he has no family history of MS. he has had some facial tingling on the left side at times, his numbness and tingling are intermittent and transient, lasting anywhere from 15 to 30 seconds.  About 2 weeks ago he felt a sudden jolt of back pain and it radiated to the left leg which is not uncommon for him.  He felt like he would fall.  He went to the bathroom and stood up and felt like the left leg would give out and fell back onto the toilet commode, did not fall to the ground.  Did not hurt himself.  His symptoms improved.  He did not seek immediate medical attention or call 911.  He lives with his wife.  He denies any history of loss of vision.  Last year he had some blurry vision but changed from contacts to his glasses and blurry vision intermittently caused him to blink more and his blurry vision improved.  He denies any double vision.  He had an eye examination last year.  He has not had a physical this year, he had blood work in September 2019.  He had a checkup with his weight management doctor in July  and was told to come back in 2 years.  Altogether, he reports that he lost about 120 pounds since his weight loss surgery 2 years ago today.  His Past Medical History Is Significant For: Past Medical History:  Diagnosis Date  . Eczema   . Pulmonary emboli ([place])     His Past Surgical History Is Significant For: Past Surgical History:  Procedure Laterality Date  . IR EPIDUROGRAPHY  12/11/2018  . KNEE SURGERY     right  . LAPAROSCOPIC GASTRIC BAND REMOVAL WITH LAPAROSCOPIC GASTRIC SLEEVE RESECTION    . WRIST FRACTURE SURGERY      His Family History Is  Significant For: Family History  Problem Relation Thompson of Onset  . Hypertension Other     His Social History Is Significant For: Social History   Socioeconomic History  . Marital status: Married    Spouse name: Not on file  . Number of children: Not on file  . Years of education: Not on file  . Highest education level: Not on file  Occupational History  . Not on file  Social Needs  . Financial resource strain: Not on file  . Food insecurity    Worry: Not on file    Inability: Not on file  . Transportation needs    Medical: Not on file    Non-medical: Not on file  Tobacco Use  . Smoking status: Never Smoker  . Smokeless tobacco: Never Used  Substance and Sexual Activity  . Alcohol use: No  . Drug use: No  . Sexual activity: Yes  Lifestyle  . Physical activity    Days per week: Not on file    Minutes per session: Not on file  . Stress: Not on file  Relationships  . Social Herbalist on phone: Not on file    Gets together: Not on file    Attends religious service: Not on file    Active member of club or organization: Not on file    Attends meetings of clubs or organizations: Not on file    Relationship status: Not on file  Other Topics Concern  . Not on file  Social History Narrative  . Not on file    His Allergies Are:  No Known Allergies:   His Current Medications Are:  Outpatient Encounter Medications as of 03/07/2019  Medication Sig  . CLOBETASOL PROPIONATE E 0.05 % emollient cream APPLY 1 APPLICATION TOPICALLY TWICE DAILY AS NEEDED  . Multiple Vitamins-Minerals (BARIATRIC MULTIVITAMINS/IRON PO) Take 1 tablet by mouth daily.  . traMADol (ULTRAM) 50 MG tablet Take 50 mg by mouth every 6 (six) hours as needed.  . [DISCONTINUED] diclofenac sodium (VOLTAREN) 1 % GEL Apply 4 g topically 4 (four) times daily.  . [DISCONTINUED] meloxicam (MOBIC) 15 MG tablet Take 1 tablet (15 mg total) by mouth daily.  . [DISCONTINUED] omeprazole (PRILOSEC) 20 MG  capsule Take 40 mg by mouth daily. Open capsule and pour into some type of food or liquid  . [DISCONTINUED] predniSONE (DELTASONE) 10 MG tablet 6 tabs po day 1, 5 tabs po day 2, 4 tabs po day 3, 3 tabs po day 4, 2 tabs po day 5, 1 tab po day 6  . [DISCONTINUED] protein supplement shake (PREMIER PROTEIN) LIQD Take 325 mLs (11 oz total) by mouth 3 (three) times daily between meals.  . [DISCONTINUED] simethicone (MYLICON) 40 99991111 drops Take 40 mg by mouth 4 (four) times daily as needed for flatulence.   No  facility-administered encounter medications on file as of 03/07/2019.   :   Review of Systems:  Out of a complete 14 point review of systems, all are reviewed and negative with the exception of these symptoms as listed below: Review of Systems  Neurological:       Pt presents today to discuss his back and neck pain with tingling all over his body. Pt is worried that he may have MS.    Objective:  Neurological Exam  Physical Exam  Physical Examination:   Vitals:   03/07/19 1011  BP: 126/88  Pulse: 72   General Examination: The patient is a very pleasant 43 y.o. male in no acute distress. He appears well-developed and well-nourished and well groomed.   HEENT: Normocephalic, atraumatic, pupils are equal, round and reactive to light and accommodation. Funduscopic exam is normal with sharp disc margins noted. Extraocular tracking is good without limitation to gaze excursion or nystagmus noted. Normal smooth pursuit is noted. Hearing is grossly intact. He wears corrective eyeglasses.  He denies diplopia. Face is symmetric with normal facial animation and normal facial sensation. Speech is clear with no dysarthria noted. There is no hypophonia. There is no lip, neck/head, jaw or voice tremor. Neck is supple with full range of passive and active motion. There are no carotid bruits on auscultation. Oropharynx exam reveals: mild mouth dryness, adequate dental hygiene and moderate airway  crowding, due to Longer uvula and tonsillar size of 2+.  Mallampati is class II.  Tongue protrudes centrally in palate elevates symmetrically.  Chest: Clear to auscultation without wheezing, rhonchi or crackles noted.  Heart: S1+S2+0, regular and normal without murmurs, rubs or gallops noted.   Abdomen: Soft, non-tender and non-distended with normal bowel sounds appreciated on auscultation.  Extremities: There is no pitting edema in the distal lower extremities bilaterally.   Skin: Warm and dry without trophic changes noted.  Musculoskeletal: exam reveals no obvious joint deformities, tenderness or joint swelling or erythema.   Neurologically:  Mental status: The patient is awake, alert and oriented in all 4 spheres. His immediate and remote memory, attention, language skills and fund of knowledge are appropriate. There is no evidence of aphasia, agnosia, apraxia or anomia. Speech is clear with normal prosody and enunciation. Thought process is linear. Mood is normal and affect is normal.  Cranial nerves II - XII are as described above under HEENT exam. In addition: shoulder shrug is normal with equal shoulder height noted. Motor exam: Normal bulk, strength and tone is noted. There is no drift, tremor or rebound. Romberg is negative. Reflexes are 1+ in the upper extremities and difficult to elicit in the lower extremities, perhaps trace in the knees and ankles.  Toes are downgoing. Fine motor skills and coordination: intact with normal finger taps, normal hand movements, normal rapid alternating patting, normal foot taps and normal foot agility.  Cerebellar testing: No dysmetria or intention tremor on finger to nose testing. Heel to shin is unremarkable bilaterally With the exception of decreased range of motion in the lower extremities, cannot start at the kneecaps, starts in the mid shin area likely due to body habitus. no truncal or gait ataxia.  Sensory exam: intact to light touch, pinprick,  vibration, temperature in the upper and lower extremities.  Gait, station and balance: He stands easily. No veering to one side is noted. No leaning to one side is noted. Posture is Thompson-appropriate and stance is narrow based. Gait shows normal stride length and normal pace. No problems  turning are noted. tandem walk is Doable slowly.              Assessment and Plan:  In summary, [name] is a very pleasant 43 y.o.-year old male with an underlying medical history of pulmonary embolus, eczema, morbid obesity with a BMI of over 50 with status post sleeve gastrectomy on 03/06/2017 through [place], who Presents for evaluation of his paresthesias in his upper and lower extremities and also at times in his face, symptoms are brief, Rather short-lived and intermittent.  No obvious triggers have been noted.  He has no sustained neurological symptoms or deficits.  Neurological exam is nonfocal today and he is largely reassured.  He had imaging of his lumbar and cervical spine with MRI recently.  I do not see a pressing reason to repeat spine scans.  Since he had some facial tingling we can certainly proceed with a brain MRI with and without contrast.  I also suggested we proceed with EMG and nerve conduction testing to rule out a widespread neurological issue such as myopathy or neuropathy.  Exam again is quite benign.  We will also do some blood work to look for any treatable causes for symptoms of neuropathy. I plan to see him back after testing is completed.  He is advised to call with any interim questions or concerns, we will also call him in the interim with test results as we get them back.  I answered all his questions today and he was in agreement with the plan.  Thank you very much for allowing me to participate in the care of this nice patient. If I can be of any further assistance to you please do not hesitate to call me at [phone].  Sincerely,   Antonio Age, MD, PhD

## 2019-03-11 LAB — ANA W/REFLEX: Anti Nuclear Antibody (ANA): NEGATIVE

## 2019-03-11 LAB — TSH: TSH: 0.988 u[IU]/mL (ref 0.450–4.500)

## 2019-03-11 LAB — COMPREHENSIVE METABOLIC PANEL
ALT: 17 IU/L (ref 0–44)
AST: 19 IU/L (ref 0–40)
Albumin/Globulin Ratio: 1.2 (ref 1.2–2.2)
Albumin: 3.9 g/dL — ABNORMAL LOW (ref 4.0–5.0)
Alkaline Phosphatase: 87 IU/L (ref 39–117)
BUN/Creatinine Ratio: 10 (ref 9–20)
BUN: 9 mg/dL (ref 6–24)
Bilirubin Total: 0.6 mg/dL (ref 0.0–1.2)
CO2: 24 mmol/L (ref 20–29)
Calcium: 9.2 mg/dL (ref 8.7–10.2)
Chloride: 105 mmol/L (ref 96–106)
Creatinine, Ser: 0.92 mg/dL (ref 0.76–1.27)
GFR calc Af Amer: 118 mL/min/{1.73_m2} (ref >59)
GFR calc non Af Amer: 102 mL/min/{1.73_m2} (ref >59)
Globulin, Total: 3.2 g/dL (ref 1.5–4.5)
Glucose: 115 mg/dL — ABNORMAL HIGH (ref 65–99)
Potassium: 4.6 mmol/L (ref 3.5–5.2)
Sodium: 139 mmol/L (ref 134–144)
Total Protein: 7.1 g/dL (ref 6.0–8.5)

## 2019-03-11 LAB — MULTIPLE MYELOMA PANEL, SERUM
Albumin SerPl Elph-Mcnc: 3.5 g/dL (ref 2.9–4.4)
Albumin/Glob SerPl: 1 (ref 0.7–1.7)
Alpha 1: 0.3 g/dL (ref 0.0–0.4)
Alpha2 Glob SerPl Elph-Mcnc: 0.7 g/dL (ref 0.4–1.0)
B-Globulin SerPl Elph-Mcnc: 1.4 g/dL — ABNORMAL HIGH (ref 0.7–1.3)
Gamma Glob SerPl Elph-Mcnc: 1.2 g/dL (ref 0.4–1.8)
Globulin, Total: 3.6 g/dL (ref 2.2–3.9)
IgA/Immunoglobulin A, Serum: 498 mg/dL — ABNORMAL HIGH (ref 90–386)
IgG (Immunoglobin G), Serum: 1484 mg/dL (ref 603–1613)
IgM (Immunoglobulin M), Srm: 53 mg/dL (ref 20–172)
M Protein SerPl Elph-Mcnc: 0.4 g/dL — ABNORMAL HIGH

## 2019-03-11 LAB — VITAMIN D 25 HYDROXY (VIT D DEFICIENCY, FRACTURES): Vit D, 25-Hydroxy: 20.4 ng/mL — ABNORMAL LOW (ref 30.0–100.0)

## 2019-03-11 LAB — RPR: RPR Ser Ql: NONREACTIVE

## 2019-03-11 LAB — B12 AND FOLATE PANEL
Folate: 9.7 ng/mL (ref >3.0)
Vitamin B-12: 309 pg/mL (ref 232–1245)

## 2019-03-11 LAB — VITAMIN B1: Thiamine: 89.3 nmol/L (ref 66.5–200.0)

## 2019-03-11 LAB — VITAMIN B6: Vitamin B6: 6.8 ug/L (ref 5.3–46.7)

## 2019-03-11 LAB — CK: Total CK: 133 U/L (ref 49–439)

## 2019-03-11 LAB — SEDIMENTATION RATE: Sed Rate: 46 mm/hr — ABNORMAL HIGH (ref 0–15)

## 2019-03-11 LAB — HGB A1C W/O EAG: Hgb A1c MFr Bld: 5.9 % — ABNORMAL HIGH (ref 4.8–5.6)

## 2019-03-12 ENCOUNTER — Telehealth: Payer: Self-pay

## 2019-03-12 DIAGNOSIS — H538 Other visual disturbances: Secondary | ICD-10-CM

## 2019-03-12 DIAGNOSIS — R200 Anesthesia of skin: Secondary | ICD-10-CM

## 2019-03-12 DIAGNOSIS — R202 Paresthesia of skin: Secondary | ICD-10-CM

## 2019-03-12 NOTE — Telephone Encounter (Signed)
Pt has called RN Kristen back, please call

## 2019-03-12 NOTE — Telephone Encounter (Signed)
I called pt and discussed his lab results and recommendations with him. He is agreeable to a hematology referral. He knows that he will be contacted with MRI and EMG/NCV results. Pt verbalized understanding of results. Pt had no questions at this time but was encouraged to call back if questions arise.

## 2019-03-12 NOTE — Telephone Encounter (Signed)
-----  Message from Star Age, MD sent at 03/12/2019  8:29 AM EDT ----- Please call patient: Labs show normal findings for thyroid screen, kidney, liver function, autoimmune screen, vit. B12.  Vitamin D level was low at 20.4 (normal range is around 30-100). I would recommend that patient start an OTC Vitamin D supplement: 1000-2000 units daily of any vitamin D supplement of His choice should be fine. I would recommend recheck of vitamin D status in 3-6 months with PCP.  ESR or sedimentation rate mildly elevated, non-specific marker for inflammation or infection, can be up with a cold, UTI, bronchitis, arthritis, etc.  Protein breakdown in blood showed abnormality with one antibody type elevated. Again, this may be an incidental anomaly, but we often request expert input with a specialist, called hematologist, please inquire, if he is okay with a referral for consultation with heme. 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 Glucose a little elevated, not severe.  We will proceed with MRI br this month, EMG/NCV next month as planned.  Michel Bickers

## 2019-03-12 NOTE — Progress Notes (Signed)
Please call patient: Labs show normal findings for thyroid screen, kidney, liver function, autoimmune screen, vit. B12.  Vitamin D level was low at 20.4 (normal range is around 30-100). I would recommend that patient start an OTC Vitamin D supplement: 1000-2000 units daily of any vitamin D supplement of His choice should be fine. I would recommend recheck of vitamin D status in 3-6 months with PCP.  ESR or sedimentation rate mildly elevated, non-specific marker for inflammation or infection, can be up with a cold, UTI, bronchitis, arthritis, etc.  Protein breakdown in blood showed abnormality with one antibody type elevated. Again, this may be an incidental anomaly, but we often request expert input with a specialist, called hematologist, please inquire, if he is okay with a referral for consultation with heme. 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 Glucose a little elevated, not severe.  We will proceed with MRI br this month, EMG/NCV next month as planned.  Michel Bickers

## 2019-03-12 NOTE — Addendum Note (Signed)
Addended by: Lester [place] A on: 03/12/2019 10:13 AM   Modules accepted: Orders

## 2019-03-12 NOTE — Telephone Encounter (Signed)
I called pt to discuss his lab results, no answer, left a message asking him to call me back.

## 2019-03-26 ENCOUNTER — Ambulatory Visit
Admission: RE | Admit: 2019-03-26 | Discharge: 2019-03-26 | Disposition: A | Discharge status: Home / Self Care | Payer: BC Managed Care – PPO | Source: Ambulatory Visit | Attending: Neurology | Admitting: Neurology

## 2019-03-26 DIAGNOSIS — R200 Anesthesia of skin: Secondary | ICD-10-CM

## 2019-03-26 DIAGNOSIS — R202 Paresthesia of skin: Secondary | ICD-10-CM

## 2019-03-26 DIAGNOSIS — H538 Other visual disturbances: Secondary | ICD-10-CM

## 2019-03-26 MED ORDER — GADOBENATE DIMEGLUMINE 529 MG/ML IV SOLN
20.0000 mL | Freq: Once | INTRAVENOUS | Status: AC | PRN
  Administered 2019-03-26: 20 mL via INTRAVENOUS

## 2019-03-28 NOTE — Progress Notes (Signed)
Please call patient regarding the recent brain MRI: The brain scan showed a normal structure of the brain and no signif. volume loss which we call atrophy. There were changes in the deeper structures of the brain, which we call white matter changes or microvascular changes. These were reported as mild in His case. These are tiny white spots, that occur with time and are seen in a variety of conditions, including with normal aging, chronic hypertension, chronic headaches, especially migraine HAs, chronic diabetes, chronic hyperlipidemia. These are not strokes and no mass or lesion or contrast enhancement was seen which is reassuring. Again, there were no acute findings, such as a stroke, or mass or blood products. No further action is required on this test at this time, other than re-enforcing the importance of good blood pressure control, good cholesterol control, good blood sugar control, and weight management. Please remind patient to keep any upcoming appointments or tests and to call us with any interim questions, concerns, problems or updates.  Proceed with EMG scheduled for next month.  Thanks,  Star Age, MD, PhD

## 2019-04-01 ENCOUNTER — Telehealth: Payer: Self-pay

## 2019-04-01 NOTE — Telephone Encounter (Signed)
-----   Message from Star Age, MD sent at 03/28/2019  5:46 PM EDT ----- Please call patient regarding the recent brain MRI: The brain scan showed a normal structure of the brain and no signif. volume loss which we call atrophy. There were changes in the deeper structures of the brain, which we call white matter changes or microvascular changes. These were reported as mild in His case. These are tiny white spots, that occur with time and are seen in a variety of conditions, including with normal aging, chronic hypertension, chronic headaches, especially migraine HAs, chronic diabetes, chronic hyperlipidemia. These are not strokes and no mass or lesion or contrast enhancement was seen which is reassuring. Again, there were no acute findings, such as a stroke, or mass or blood products. No further action is required on this test at this time, other than re-enforcing the importance of good blood pressure control, good cholesterol control, good blood sugar control, and weight management. Please remind patient to keep any upcoming appointments or tests and to call us with any interim questions, concerns, problems or updates.  Proceed with EMG scheduled for next month.  Thanks,  Star Age, MD, PhD

## 2019-04-01 NOTE — Telephone Encounter (Signed)
I called pt and discussed his MRI results and recommendations. Pt needs to cancel his EMG/NCV for next week because he will be out of town. He is going to call us to reschedule it. Pt verbalized understanding of results. Pt had no questions at this time but was encouraged to call back if questions arise.

## 2019-04-02 ENCOUNTER — Other Ambulatory Visit: Payer: Self-pay | Admitting: Family

## 2019-04-02 DIAGNOSIS — R778 Other specified abnormalities of plasma proteins: Secondary | ICD-10-CM

## 2019-04-03 ENCOUNTER — Telehealth: Payer: Self-pay | Admitting: Family

## 2019-04-03 ENCOUNTER — Ambulatory Visit (HOSPITAL_BASED_OUTPATIENT_CLINIC_OR_DEPARTMENT_OTHER)
Admission: RE | Admit: 2019-04-03 | Discharge: 2019-04-03 | Disposition: A | Discharge status: Home / Self Care | Payer: BC Managed Care – PPO | Source: Ambulatory Visit | Attending: Family | Admitting: Family

## 2019-04-03 ENCOUNTER — Other Ambulatory Visit: Payer: Self-pay

## 2019-04-03 ENCOUNTER — Inpatient Hospital Stay (HOSPITAL_BASED_OUTPATIENT_CLINIC_OR_DEPARTMENT_OTHER): Payer: BC Managed Care – PPO | Admitting: Family

## 2019-04-03 ENCOUNTER — Inpatient Hospital Stay: Payer: BC Managed Care – PPO | Attending: Family

## 2019-04-03 VITALS — BP 140/68 | HR 78 | Temp 96.0°F | Resp 16 | Wt >= 6400 oz

## 2019-04-03 DIAGNOSIS — R200 Anesthesia of skin: Secondary | ICD-10-CM | POA: Diagnosis not present

## 2019-04-03 DIAGNOSIS — R202 Paresthesia of skin: Secondary | ICD-10-CM | POA: Insufficient documentation

## 2019-04-03 DIAGNOSIS — C9000 Multiple myeloma not having achieved remission: Secondary | ICD-10-CM

## 2019-04-03 DIAGNOSIS — Z86711 Personal history of pulmonary embolism: Secondary | ICD-10-CM

## 2019-04-03 DIAGNOSIS — K921 Melena: Secondary | ICD-10-CM | POA: Diagnosis not present

## 2019-04-03 DIAGNOSIS — R768 Other specified abnormal immunological findings in serum: Secondary | ICD-10-CM | POA: Diagnosis not present

## 2019-04-03 DIAGNOSIS — R778 Other specified abnormalities of plasma proteins: Secondary | ICD-10-CM

## 2019-04-03 LAB — CBC WITH DIFFERENTIAL (CANCER CENTER ONLY)
Abs Immature Granulocytes: 0.03 10*3/uL (ref 0.00–0.07)
Basophils Absolute: 0.1 10*3/uL (ref 0.0–0.1)
Basophils Relative: 1 %
Eosinophils Absolute: 0.2 10*3/uL (ref 0.0–0.5)
Eosinophils Relative: 3 %
HCT: 45.1 % (ref 39.0–52.0)
Hemoglobin: 14.7 g/dL (ref 13.0–17.0)
Immature Granulocytes: 0 %
Lymphocytes Relative: 30 %
Lymphs Abs: 2.5 10*3/uL (ref 0.7–4.0)
MCH: 29.3 pg (ref 26.0–34.0)
MCHC: 32.6 g/dL (ref 30.0–36.0)
MCV: 89.8 fL (ref 80.0–100.0)
Monocytes Absolute: 0.6 10*3/uL (ref 0.1–1.0)
Monocytes Relative: 7 %
Neutro Abs: 4.9 10*3/uL (ref 1.7–7.7)
Neutrophils Relative %: 59 %
Platelet Count: 276 10*3/uL (ref 150–400)
RBC: 5.02 MIL/uL (ref 4.22–5.81)
RDW: 12.8 % (ref 11.5–15.5)
WBC Count: 8.2 10*3/uL (ref 4.0–10.5)
nRBC: 0 % (ref 0.0–0.2)

## 2019-04-03 LAB — CMP (CANCER CENTER ONLY)
ALT: 18 U/L (ref 0–44)
AST: 16 U/L (ref 15–41)
Albumin: 4.2 g/dL (ref 3.5–5.0)
Alkaline Phosphatase: 69 U/L (ref 38–126)
Anion gap: 7 (ref 5–15)
BUN: 12 mg/dL (ref 6–20)
CO2: 28 mmol/L (ref 22–32)
Calcium: 9.4 mg/dL (ref 8.9–10.3)
Chloride: 105 mmol/L (ref 98–111)
Creatinine: 0.95 mg/dL (ref 0.61–1.24)
GFR, Est AFR Am: >60 mL/min (ref >60)
GFR, Estimated: >60 mL/min (ref >60)
Glucose, Bld: 101 mg/dL — ABNORMAL HIGH (ref 70–99)
Potassium: 4.1 mmol/L (ref 3.5–5.1)
Sodium: 140 mmol/L (ref 135–145)
Total Bilirubin: 0.8 mg/dL (ref 0.3–1.2)
Total Protein: 7.5 g/dL (ref 6.5–8.1)

## 2019-04-03 LAB — LACTATE DEHYDROGENASE: LDH: 155 U/L (ref 98–192)

## 2019-04-03 LAB — SAVE SMEAR(SSMR), FOR PROVIDER SLIDE REVIEW

## 2019-04-03 NOTE — Progress Notes (Signed)
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 Star Age. She drew a Myeloma panel on him which showed his M-spike was 0.4 and IgA level 498.  His MRI's of the cervical spine and brain were negative. He will be rescheduling his EMG with nerve conduction test for in a couple weeks. He is out of town next week.  He had a fall last month when his leg locked up on him and that prompted his follow-up for further work up.  He has had no issue with frequent infections. No fever, chills, n/v, sough, rash, dizziness, SOB, chest pain, palpitations, abdominal pain or changes in bowel or bladder habits.  He has an occasional headache which resolves with tylenol.  No hot flashes or night sweats.  No personal history of HTN, thyroid disease or diabetes.  No personal history of cancer. No sickle cell disease or trait.  He states that that there his no known family history of blood disorders or cancer.  No swelling or tenderness in his extremities. He will sometimes have puffiness in his feet and ankles at the end of the day which resolves once he lays down.  He sometimes notes a little bright red blood in his stool which he states is due to hemorrhoids.  No other episodes of bleeding, no bruising or petechiae.  He takes vitamin D, a bariatric vitamin and an apple cider vinegar gummy daily.  He is eating well and states that he is staying appropriately hydrated. He does not smoke or use recreation drugs.  He rarely has an alcoholic beverage socially.   He had the gastric sleeve surgery on 03/06/2018 and states that he lost around 125 lbs and has gain back 40-50 of that.  A week after his bariatric surgery he developed chest pain and was diagnosed with a PE. He was initial treated with Lovenox and then transitioned to Coumadin. He has had no recurrent thrombus.   He is staying busy walking for exercise.   ROS: All other 10 point review of systems is negative.   PAST MEDICAL HISTORY:   Past Medical History:  Diagnosis Date  . Eczema   . Pulmonary emboli (HCC)     ALLERGIES: No Known Allergies    MEDICATIONS:  Current Outpatient Medications on File Prior to Visit  Medication Sig Dispense Refill  . CLOBETASOL PROPIONATE E 0.05 % emollient cream APPLY 1 APPLICATION TOPICALLY TWICE DAILY AS NEEDED  0  . Multiple Vitamins-Minerals (BARIATRIC MULTIVITAMINS/IRON PO) Take 1 tablet by mouth daily.    . traMADol (ULTRAM) 50 MG tablet Take 50 mg by mouth every 6 (six) hours as needed.     No current facility-administered medications on file prior to visit.      PAST SURGICAL HISTORY Past Surgical History:  Procedure Laterality Date  . IR EPIDUROGRAPHY  12/11/2018  . KNEE SURGERY     right  . LAPAROSCOPIC GASTRIC BAND REMOVAL WITH LAPAROSCOPIC GASTRIC SLEEVE RESECTION    . WRIST FRACTURE SURGERY      FAMILY HISTORY: Family History  Problem  Relation Age of Onset  . Hypertension Other     SOCIAL HISTORY:  reports that he has never smoked. He has never used smokeless tobacco. He reports that he does not drink alcohol or use drugs.  PERFORMANCE STATUS: The patient's performance status is 1 - Symptomatic but completely ambulatory  PHYSICAL EXAM: Most Recent Vital Signs: There were no vitals taken for this visit. There were no vitals taken for this visit.  General Appearance:    Alert, cooperative, no distress, appears stated age  Head:    Normocephalic, without obvious abnormality, atraumatic  Eyes:    PERRL, conjunctiva/corneas  clear, EOM's intact, fundi    benign, both eyes        Throat:   Lips, mucosa, and tongue normal; teeth and gums normal  Neck:   Supple, symmetrical, trachea midline, no adenopathy;    thyroid:  no enlargement/tenderness/nodules; no carotid   bruit or JVD  Back:     Symmetric, no curvature, ROM normal, no CVA tenderness  Lungs:     Clear to auscultation bilaterally, respirations unlabored  Chest Wall:    No tenderness or deformity   Heart:    Regular rate and rhythm, S1 and S2 normal, no murmur, rub   or gallop     Abdomen:     Soft, non-tender, bowel sounds active all four quadrants,    no masses, no organomegaly        Extremities:   Extremities normal, atraumatic, no cyanosis or edema  Pulses:   2+ and symmetric all extremities  Skin:   Skin color, texture, turgor normal, no rashes or lesions  Lymph nodes:   Cervical, supraclavicular, and axillary nodes normal  Neurologic:   CNII-XII intact, normal strength, sensation and reflexes    throughout    LABORATORY DATA:  No results found for this or any previous visit (from the past 48 hour(s)).    RADIOGRAPHY: No results found.     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 His M-spike last month was 0.4 and IgA level 498.  His only complaint at his time is the numbness and tingling he is experiencing "all over". He is currently being worked up by Neurology.  We will get a 24 hour urine on him as well as a Bone Survey.  Once we have all his results in hand we will determine a treatment plan if needed.  We will go ahead and plan to see him back in another 3 months.   All questions were answered and he is in agreement with the plan. He will contact our office with any questions or concerns. We can certainly see him sooner if needed.   He was discussed with and also seen by Dr. Marin Olp and he is in agreement with the aforementioned.   Laverna Peace,  NP    Addendum: I saw and examined [name] with Judson Roch.  He is very nice.  He has a incredibly interesting job that we talked about for quite a while.  He was found to have this low level monoclonal protein while being worked up for this tingling and numbness in his feet.  He is incredibly large.  He actually had a gastric sleeve done.  He had weighed over 500 pounds.  I would be shocked if he had anything other than the MGUS.  I would think that the MGUS is more reactive than any indication of a plasma  cell process.  There is no history of hematologic disorders in the family.  There is no history of sickle cell in the family.  I would like to get a 24-hour urine on him.  We will see what this shows.  I also would like to get a bone survey on him.  We will see if there is any evidence of myelomatous lesions.  I doubt that he needs a bone marrow biopsy.  I am not sure or convinced that the monoclonal myopathy is related to this neuropathy.  We spent about 45 minutes with [name].  Again he is an incredibly interesting guy.  It was really fun talking to him.  We answered all of his questions.  I will plan to see him back in 6 months.  If, we find something on his myeloma studies, 24-hour urine, or bone survey, we will get him back sooner.  Lattie Haw, MD

## 2019-04-03 NOTE — Telephone Encounter (Signed)
Appointments scheduled calendar printed per 11/4 los

## 2019-04-04 ENCOUNTER — Telehealth: Payer: Self-pay | Admitting: *Deleted

## 2019-04-04 LAB — PROTEIN ELECTROPHORESIS, SERUM
A/G Ratio: 0.9 (ref 0.7–1.7)
Albumin ELP: 3.3 g/dL (ref 2.9–4.4)
Alpha-1-Globulin: 0.2 g/dL (ref 0.0–0.4)
Alpha-2-Globulin: 0.7 g/dL (ref 0.4–1.0)
Beta Globulin: 1.3 g/dL (ref 0.7–1.3)
Gamma Globulin: 1.4 g/dL (ref 0.4–1.8)
Globulin, Total: 3.7 g/dL (ref 2.2–3.9)
M-Spike, %: 0.4 g/dL — ABNORMAL HIGH
Total Protein ELP: 7 g/dL (ref 6.0–8.5)

## 2019-04-04 LAB — KAPPA/LAMBDA LIGHT CHAINS
Kappa free light chain: 22.8 mg/L — ABNORMAL HIGH (ref 3.3–19.4)
Kappa, lambda light chain ratio: 1.21 (ref 0.26–1.65)
Lambda free light chains: 18.9 mg/L (ref 5.7–26.3)

## 2019-04-04 LAB — IGG, IGA, IGM
IgA: 489 mg/dL — ABNORMAL HIGH (ref 90–386)
IgG (Immunoglobin G), Serum: 1501 mg/dL (ref 603–1613)
IgM (Immunoglobulin M), Srm: 60 mg/dL (ref 20–172)

## 2019-04-04 LAB — BETA 2 MICROGLOBULIN, SERUM: Beta-2 Microglobulin: 1.7 mg/L (ref 0.6–2.4)

## 2019-04-04 NOTE — Telephone Encounter (Signed)
Patient notified per order of S. [place] NP that the bone survey "looks great!!  No bone lesions or evidence of malignancy!!  WOO HOO!!"  Pt appreciative of call and has no questions or concerns at this time.

## 2019-04-04 NOTE — Telephone Encounter (Signed)
-----   Message from Eliezer Bottom, NP sent at 04/04/2019 10:43 AM EST ----- Looks great! No bone lesions or evidence of malignancy!! WOO HOO!!!   ----- Message ----- From: Interface, Rad Results In Sent: 04/04/2019   9:02 AM EST To: Eliezer Bottom, NP

## 2019-04-08 DIAGNOSIS — R768 Other specified abnormal immunological findings in serum: Secondary | ICD-10-CM | POA: Diagnosis not present

## 2019-04-10 LAB — UIFE/LIGHT CHAINS/TP QN, 24-HR UR
FR KAPPA LT CH,24HR: 32.82 mg/24 hr
FR LAMBDA LT CH,24HR: 1.73 mg/24 hr
Free Kappa Lt Chains,Ur: 19.89 mg/L (ref 0.63–113.79)
Free Kappa/Lambda Ratio: 18.94 (ref 1.03–31.76)
Free Lambda Lt Chains,Ur: 1.05 mg/L (ref 0.47–11.77)
Total Protein, Urine-Ur/day: 142 mg/24 hr (ref 30–150)
Total Protein, Urine: 8.6 mg/dL
Total Volume: 1650

## 2019-04-11 ENCOUNTER — Encounter: Payer: BC Managed Care – PPO | Admitting: Diagnostic Neuroimaging

## 2019-06-05 ENCOUNTER — Ambulatory Visit (INDEPENDENT_AMBULATORY_CARE_PROVIDER_SITE_OTHER): Payer: Self-pay | Admitting: Family Medicine

## 2019-06-05 ENCOUNTER — Other Ambulatory Visit: Payer: Self-pay

## 2019-06-05 ENCOUNTER — Encounter: Payer: Self-pay | Admitting: Family Medicine

## 2019-06-05 VITALS — BP 121/88 | HR 86 | Ht 77.0 in | Wt >= 6400 oz

## 2019-06-05 DIAGNOSIS — M542 Cervicalgia: Secondary | ICD-10-CM

## 2019-06-05 DIAGNOSIS — M5442 Lumbago with sciatica, left side: Secondary | ICD-10-CM

## 2019-06-05 MED ORDER — CYCLOBENZAPRINE HCL 10 MG PO TABS: ORAL_TABLET | ORAL | 0 refills | Status: DC

## 2019-06-05 MED ORDER — PREDNISONE 5 MG PO TABS: ORAL_TABLET | ORAL | 0 refills | Status: DC
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
## 2019-06-05 NOTE — Patient Instructions (Signed)
Nice to meet you Please try heat on the low back  Please try the exercises   Please send me a message in MyChart with any questions or updates.  Please see me back in 4 weeks.   --Dr. Raeford Razor

## 2019-06-05 NOTE — Assessment & Plan Note (Signed)
Likely spasm in the lower back with some radicular symptoms down the left leg.  Has a history of degenerative changes.  Symptoms today seem more nerve impingement as well. -Prednisone. -Flexeril. -If no improvement consider physical therapy and imaging.
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
## 2019-06-10 ENCOUNTER — Telehealth: Payer: Self-pay

## 2019-06-10 NOTE — Telephone Encounter (Signed)
I reached out to the [name] per Dr. Guadelupe Sabin request to see about canceling the 06/11/2019 appointment and rescheduling the Nov EMG//NCS that was canceled.  [name] was agreeable. I have canceled the the appointment scheduled for tomorrow and have rescheduled EMG/NCS appointment for 06/13/2019 with Dr. Leta Baptist.  [name] was advised once we receive the results we would advise on when to f/u.  [name] was agreeable.

## 2019-06-11 ENCOUNTER — Ambulatory Visit: Payer: BC Managed Care – PPO | Admitting: Neurology

## 2019-06-13 ENCOUNTER — Encounter: Payer: Self-pay | Admitting: Diagnostic Neuroimaging

## 2019-07-03 ENCOUNTER — Ambulatory Visit: Payer: BC Managed Care – PPO | Admitting: Family Medicine

## 2019-07-03 ENCOUNTER — Other Ambulatory Visit: Payer: Self-pay | Admitting: Family

## 2019-07-03 DIAGNOSIS — R778 Other specified abnormalities of plasma proteins: Secondary | ICD-10-CM

## 2019-07-03 DIAGNOSIS — D472 Monoclonal gammopathy: Secondary | ICD-10-CM

## 2019-07-04 ENCOUNTER — Inpatient Hospital Stay: Payer: BC Managed Care – PPO | Attending: Family

## 2019-07-04 ENCOUNTER — Encounter: Payer: Self-pay | Admitting: Family

## 2019-07-04 ENCOUNTER — Other Ambulatory Visit: Payer: Self-pay

## 2019-07-04 ENCOUNTER — Inpatient Hospital Stay (HOSPITAL_BASED_OUTPATIENT_CLINIC_OR_DEPARTMENT_OTHER): Payer: BC Managed Care – PPO | Admitting: Family

## 2019-07-04 VITALS — BP 119/78 | HR 66 | Temp 97.1°F | Resp 18 | Ht 75.0 in | Wt >= 6400 oz

## 2019-07-04 DIAGNOSIS — D472 Monoclonal gammopathy: Secondary | ICD-10-CM | POA: Insufficient documentation

## 2019-07-04 DIAGNOSIS — R778 Other specified abnormalities of plasma proteins: Secondary | ICD-10-CM

## 2019-07-04 LAB — CBC WITH DIFFERENTIAL (CANCER CENTER ONLY)
Abs Immature Granulocytes: 0.05 10*3/uL (ref 0.00–0.07)
Basophils Absolute: 0 10*3/uL (ref 0.0–0.1)
Basophils Relative: 1 %
Eosinophils Absolute: 0.2 10*3/uL (ref 0.0–0.5)
Eosinophils Relative: 2 %
HCT: 43 % (ref 39.0–52.0)
Hemoglobin: 14.1 g/dL (ref 13.0–17.0)
Immature Granulocytes: 1 %
Lymphocytes Relative: 28 %
Lymphs Abs: 2.3 10*3/uL (ref 0.7–4.0)
MCH: 29.1 pg (ref 26.0–34.0)
MCHC: 32.8 g/dL (ref 30.0–36.0)
MCV: 88.7 fL (ref 80.0–100.0)
Monocytes Absolute: 0.6 10*3/uL (ref 0.1–1.0)
Monocytes Relative: 8 %
Neutro Abs: 5.1 10*3/uL (ref 1.7–7.7)
Neutrophils Relative %: 60 %
Platelet Count: 234 10*3/uL (ref 150–400)
RBC: 4.85 MIL/uL (ref 4.22–5.81)
RDW: 13.2 % (ref 11.5–15.5)
WBC Count: 8.4 10*3/uL (ref 4.0–10.5)
nRBC: 0 % (ref 0.0–0.2)

## 2019-07-04 LAB — CMP (CANCER CENTER ONLY)
ALT: 16 U/L (ref 0–44)
AST: 13 U/L — ABNORMAL LOW (ref 15–41)
Albumin: 4 g/dL (ref 3.5–5.0)
Alkaline Phosphatase: 61 U/L (ref 38–126)
Anion gap: 8 (ref 5–15)
BUN: 10 mg/dL (ref 6–20)
CO2: 26 mmol/L (ref 22–32)
Calcium: 9.4 mg/dL (ref 8.9–10.3)
Chloride: 105 mmol/L (ref 98–111)
Creatinine: 0.78 mg/dL (ref 0.61–1.24)
GFR, Est AFR Am: >60 mL/min (ref >60)
GFR, Estimated: >60 mL/min (ref >60)
Glucose, Bld: 96 mg/dL (ref 70–99)
Potassium: 3.6 mmol/L (ref 3.5–5.1)
Sodium: 139 mmol/L (ref 135–145)
Total Bilirubin: 0.8 mg/dL (ref 0.3–1.2)
Total Protein: 7.5 g/dL (ref 6.5–8.1)
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
## 2019-07-05 LAB — IGG, IGA, IGM
IgA: 465 mg/dL — ABNORMAL HIGH (ref 90–386)
IgG (Immunoglobin G), Serum: 1373 mg/dL (ref 603–1613)
IgM (Immunoglobulin M), Srm: 58 mg/dL (ref 20–172)

## 2019-07-05 LAB — KAPPA/LAMBDA LIGHT CHAINS
Kappa free light chain: 23.5 mg/L — ABNORMAL HIGH (ref 3.3–19.4)
Kappa, lambda light chain ratio: 1.05 (ref 0.26–1.65)
Lambda free light chains: 22.3 mg/L (ref 5.7–26.3)

## 2019-07-08 LAB — PROTEIN ELECTROPHORESIS, SERUM, WITH REFLEX
A/G Ratio: 1.1 (ref 0.7–1.7)
Albumin ELP: 3.8 g/dL (ref 2.9–4.4)
Alpha-1-Globulin: 0.2 g/dL (ref 0.0–0.4)
Alpha-2-Globulin: 0.7 g/dL (ref 0.4–1.0)
Beta Globulin: 1.3 g/dL (ref 0.7–1.3)
Gamma Globulin: 1.4 g/dL (ref 0.4–1.8)
Globulin, Total: 3.6 g/dL (ref 2.2–3.9)
M-Spike, %: 0.5 g/dL — ABNORMAL HIGH
SPEP Interpretation: 0
Total Protein ELP: 7.4 g/dL (ref 6.0–8.5)

## 2019-07-08 LAB — IMMUNOFIXATION REFLEX, SERUM
IgA: 558 mg/dL — ABNORMAL HIGH (ref 90–386)
IgG (Immunoglobin G), Serum: 1713 mg/dL — ABNORMAL HIGH (ref 603–1613)
IgM (Immunoglobulin M), Srm: 63 mg/dL (ref 20–172)

## 2019-07-09 ENCOUNTER — Other Ambulatory Visit: Payer: Self-pay | Admitting: Family

## 2019-10-01 NOTE — Progress Notes (Signed)
Junuis Vidovich - 44 y.o. male MRN [id number]  Date of birth: [birth year]  SUBJECTIVE:  Including CC & ROS.  Chief Complaint  Patient presents with  . Follow-up    left-sided low back and left knee    Yordin Mais is a 44 y.o. male that is presenting with acute worsening of chronic back pain as well as having left knee pain.  The pain in the lower back seems to have worsened as of late. It is mainly located in the lower back. He has tried facet injections before. His left knee is acute on chronic in nature. Has a distant history of a knee injury while playing football. Feels anterior. No mechanical symtpoms.  Independent review of the cervical spine MRI from 02/28/2019 shows no significant findings.  Independent review of the MRI lumbar spine from 08/20/2018 shows disc space narrowing at L5-S1 with degenerative changes at the L5-S1 facet joint.   Review of Systems See HPI   HISTORY: Past Medical, Surgical, Social, and Family History Reviewed & Updated per EMR.   Pertinent Historical Findings include:  Past Medical History:  Diagnosis Date  . Eczema   . Pulmonary emboli [facility])     Past Surgical History:  Procedure Laterality Date  . IR EPIDUROGRAPHY  12/11/2018  . KNEE SURGERY     right  . LAPAROSCOPIC GASTRIC BAND REMOVAL WITH LAPAROSCOPIC GASTRIC SLEEVE RESECTION    . WRIST FRACTURE SURGERY      Family History  Problem Relation Age of Onset  . Hypertension Other     Social History   Socioeconomic History  . Marital status: Married    Spouse name: Not on file  . Number of children: Not on file  . Years of education: Not on file  . Highest education level: Not on file  Occupational History  . Not on file  Tobacco Use  . Smoking status: Never Smoker  . Smokeless tobacco: Never Used  Substance and Sexual Activity  . Alcohol use: No  . Drug use: No  . Sexual activity: Yes  Other Topics Concern  . Not on file  Social History Narrative  . Not on file   Social  Determinants of Health   Financial Resource Strain:   . Difficulty of Paying Living Expenses:   Food Insecurity:   . Worried About Charity fundraiser in the Last Year:   . Arboriculturist in the Last Year:   Transportation Needs:   . Film/video editor (Medical):   Marland Kitchen Lack of Transportation (Non-Medical):   Physical Activity:   . Days of Exercise per Week:   . Minutes of Exercise per Session:   Stress:   . Feeling of Stress :   Social Connections:   . Frequency of Communication with Friends and Family:   . Frequency of Social Gatherings with Friends and Family:   . Attends Religious Services:   . Active Member of Clubs or Organizations:   . Attends Archivist Meetings:   Marland Kitchen Marital Status:   Intimate Partner Violence:   . Fear of Current or Ex-Partner:   . Emotionally Abused:   Marland Kitchen Physically Abused:   . Sexually Abused:      PHYSICAL EXAM:  VS: BP (!) 141/90   Pulse 85   Ht 6\' 4"  (1.93 m)   BMI 56.85 kg/m  Physical Exam Gen: NAD, alert, cooperative with exam, well-appearing MSK:  Back:  Limited flexion and extension  Normal IR and ER of the  hips.  Left knee:  No obvious effusion  Normal ROM  No instability  NVI     ASSESSMENT & PLAN:   Patellofemoral pain syndrome of left knee Pain over the anterior aspect of the knee.  Does have a distant history of injury.  Possible to degenerative changes versus patellofemoral. -Counseled on home exercise therapy and supportive care. -X-ray. -Reaction brace. -Could consider physical therapy or injection.  Obesity, Class III, BMI 40-49.9 (morbid obesity) ([place]) He has a history of sleeve gastrectomy.  Had discussion about potential options.  He is currently exercising.  Would consider seeing a primary care in order to consider medication help with losing weight.  Acute bilateral low back pain with left-sided sciatica Acute worsening of his low back pain.  He has tried injections previously.  Try to limit his  NSAID use as he does have a history of sleeve gastrectomy.  Counseled on weight loss as a long-term goal in order to improve his ongoing back pain. -Counseled on home exercise therapy and supportive care. -Celebrex. -Counseled on Tylenol use. - referral to Dr. Ellene Route

## 2019-10-02 ENCOUNTER — Ambulatory Visit (INDEPENDENT_AMBULATORY_CARE_PROVIDER_SITE_OTHER): Payer: Self-pay | Admitting: Family Medicine

## 2019-10-02 ENCOUNTER — Other Ambulatory Visit: Payer: Self-pay

## 2019-10-02 ENCOUNTER — Encounter: Payer: Self-pay | Admitting: Family Medicine

## 2019-10-02 ENCOUNTER — Ambulatory Visit (HOSPITAL_BASED_OUTPATIENT_CLINIC_OR_DEPARTMENT_OTHER)
Admission: RE | Admit: 2019-10-02 | Discharge: 2019-10-02 | Disposition: A | Discharge status: Home / Self Care | Payer: BC Managed Care – PPO | Source: Ambulatory Visit | Attending: Family Medicine | Admitting: Family Medicine

## 2019-10-02 VITALS — BP 141/90 | HR 85 | Ht 76.0 in

## 2019-10-02 DIAGNOSIS — M5442 Lumbago with sciatica, left side: Secondary | ICD-10-CM

## 2019-10-02 DIAGNOSIS — M222X2 Patellofemoral disorders, left knee: Secondary | ICD-10-CM | POA: Insufficient documentation

## 2019-10-02 MED ORDER — CELECOXIB 200 MG PO CAPS: ORAL_CAPSULE | ORAL | 2 refills | Status: DC

## 2019-10-02 NOTE — Assessment & Plan Note (Signed)
Pain over the anterior aspect of the knee.  Does have a distant history of injury.  Possible to degenerative changes versus patellofemoral. -Counseled on home exercise therapy and supportive care. -X-ray. -Reaction brace. -Could consider physical therapy or injection.

## 2019-10-02 NOTE — Assessment & Plan Note (Signed)
Acute worsening of his low back pain.  He has tried injections previously.  Try to limit his NSAID use as he does have a history of sleeve gastrectomy.  Counseled on weight loss as a long-term goal in order to improve his ongoing back pain. -Counseled on home exercise therapy and supportive care. -Celebrex. -Counseled on Tylenol use. - referral to Dr. Ellene Route

## 2019-10-02 NOTE — Assessment & Plan Note (Signed)
He has a history of sleeve gastrectomy.  Had discussion about potential options.  He is currently exercising.  Would consider seeing a primary care in order to consider medication help with losing weight.

## 2019-10-02 NOTE — Patient Instructions (Signed)
Good to see you  Please try the exercises  Please try the celebrex  Please use tylenol  Please try ice  I will call with the results from today  Please try to establish with a primary doctor  Please send me a message in [place] with any questions or updates.  Please see me back in 4-6 weeks.   --Dr. Raeford Razor

## 2019-10-03 ENCOUNTER — Telehealth: Payer: Self-pay | Admitting: Family Medicine

## 2019-10-03 NOTE — Telephone Encounter (Signed)
Spoke with patient about Pecola Leisure, MD [facility] 10/03/2019, 3:08 PM

## 2019-10-30 ENCOUNTER — Ambulatory Visit: Payer: BC Managed Care – PPO | Admitting: Family Medicine

## 2019-10-30 NOTE — Progress Notes (Deleted)
°  [name] - 44 y.o. male MRN [id number]  Date of birth: 04-14-1976  SUBJECTIVE:  Including CC & ROS.  No chief complaint on file.   [name] is a 44 y.o. male that is  ***.  ***   Review of Systems See HPI   HISTORY: Past Medical, Surgical, Social, and Family History Reviewed & Updated per EMR.   Pertinent Historical Findings include:  Past Medical History:  Diagnosis Date   Eczema    Pulmonary emboli (HCC)     Past Surgical History:  Procedure Laterality Date   IR EPIDUROGRAPHY  12/11/2018   KNEE SURGERY     right   LAPAROSCOPIC GASTRIC BAND REMOVAL WITH LAPAROSCOPIC GASTRIC SLEEVE RESECTION     WRIST FRACTURE SURGERY      Family History  Problem Relation Age of Onset   Hypertension Other     Social History   Socioeconomic History   Marital status: Married    Spouse name: Not on file   Number of children: Not on file   Years of education: Not on file   Highest education level: Not on file  Occupational History   Not on file  Tobacco Use   Smoking status: Never Smoker   Smokeless tobacco: Never Used  Substance and Sexual Activity   Alcohol use: No   Drug use: No   Sexual activity: Yes  Other Topics Concern   Not on file  Social History Narrative   Not on file   Social Determinants of Health   Financial Resource Strain:    Difficulty of Paying Living Expenses:   Food Insecurity:    Worried About Charity fundraiser in the Last Year:    Arboriculturist in the Last Year:   Transportation Needs:    Film/video editor (Medical):    Lack of Transportation (Non-Medical):   Physical Activity:    Days of Exercise per Week:    Minutes of Exercise per Session:   Stress:    Feeling of Stress :   Social Connections:    Frequency of Communication with Friends and Family:    Frequency of Social Gatherings with Friends and Family:    Attends Religious Services:    Active Member of Clubs or Organizations:     Attends Music therapist:    Marital Status:   Intimate Partner Violence:    Fear of Current or Ex-Partner:    Emotionally Abused:    Physically Abused:    Sexually Abused:      PHYSICAL EXAM:  VS: There were no vitals taken for this visit. Physical Exam Gen: NAD, alert, cooperative with exam, well-appearing MSK:  ***      ASSESSMENT & PLAN:   No problem-specific Assessment & Plan notes found for this encounter.

## 2019-10-31 ENCOUNTER — Ambulatory Visit: Payer: BC Managed Care – PPO | Admitting: Family Medicine

## 2019-10-31 ENCOUNTER — Other Ambulatory Visit: Payer: Self-pay

## 2019-10-31 ENCOUNTER — Ambulatory Visit: Payer: Self-pay

## 2019-10-31 ENCOUNTER — Encounter: Payer: Self-pay | Admitting: Family Medicine

## 2019-10-31 VITALS — BP 149/92 | HR 73 | Ht 77.0 in | Wt >= 6400 oz

## 2019-10-31 DIAGNOSIS — M222X2 Patellofemoral disorders, left knee: Secondary | ICD-10-CM

## 2019-10-31 DIAGNOSIS — M5442 Lumbago with sciatica, left side: Secondary | ICD-10-CM | POA: Diagnosis not present

## 2019-10-31 MED ORDER — TRIAMCINOLONE ACETONIDE 40 MG/ML IJ SUSP
40.0000 mg | Freq: Once | INTRAMUSCULAR | Status: AC
  Administered 2019-10-31: 40 mg via INTRA_ARTICULAR

## 2019-10-31 NOTE — Progress Notes (Addendum)
[name] - 44 y.o. male MRN [id number]  Date of birth: [birth year]  SUBJECTIVE:  Including CC & ROS.  Chief Complaint  Patient presents with  . Follow-up    low back / left knee    [name] is a 44 y.o. male that is following up for his back pain as well as his left knee pain.  He reports seeing Dr. Ellene Route who is helping him with his back.  They reviewed his MRI and is unsure of the exact source of his pain.  He is going to talk with Dr. Mina Marble previous injections attempted.  He may attempt other injections to try to help alleviate his pain.  His left knee is still having pain as well.  He feels like it may give from time to time.  It seems to be localized to the knee but also occurring medial laterally.   Review of Systems See HPI   HISTORY: Past Medical, Surgical, Social, and Family History Reviewed & Updated per EMR.   Pertinent Historical Findings include:  Past Medical History:  Diagnosis Date  . Eczema   . Pulmonary emboli [facility])     Past Surgical History:  Procedure Laterality Date  . IR EPIDUROGRAPHY  12/11/2018  . KNEE SURGERY     right  . LAPAROSCOPIC GASTRIC BAND REMOVAL WITH LAPAROSCOPIC GASTRIC SLEEVE RESECTION    . WRIST FRACTURE SURGERY      Family History  Problem Relation Age of Onset  . Hypertension Other     Social History   Socioeconomic History  . Marital status: Married    Spouse name: Not on file  . Number of children: Not on file  . Years of education: Not on file  . Highest education level: Not on file  Occupational History  . Not on file  Tobacco Use  . Smoking status: Never Smoker  . Smokeless tobacco: Never Used  Vaping Use  . Vaping Use: Never used  Substance and Sexual Activity  . Alcohol use: No  . Drug use: No  . Sexual activity: Yes  Other Topics Concern  . Not on file  Social History Narrative  . Not on file   Social Determinants of Health   Financial Resource Strain:   . Difficulty of Paying Living Expenses:     Food Insecurity:   . Worried About Charity fundraiser in the Last Year:   . Arboriculturist in the Last Year:   Transportation Needs:   . Film/video editor (Medical):   Marland Kitchen Lack of Transportation (Non-Medical):   Physical Activity:   . Days of Exercise per Week:   . Minutes of Exercise per Session:   Stress:   . Feeling of Stress :   Social Connections:   . Frequency of Communication with Friends and Family:   . Frequency of Social Gatherings with Friends and Family:   . Attends Religious Services:   . Active Member of Clubs or Organizations:   . Attends Archivist Meetings:   Marland Kitchen Marital Status:   Intimate Partner Violence:   . Fear of Current or Ex-Partner:   . Emotionally Abused:   Marland Kitchen Physically Abused:   . Sexually Abused:      PHYSICAL EXAM:  VS: BP (!) 149/92   Pulse 73   Ht 6\' 5"  (1.956 m)   Wt (!) 430 lb (195 kg)   BMI 50.99 kg/m  Physical Exam Gen: NAD, alert, cooperative with exam, well-appearing MSK:  Left  knee: No obvious effusion. Normal range of motion. Normal strength resistance. No instability with valgus or varus stress testing. Negative Murray's test. Neurovascular intact   Aspiration/Injection Procedure Note [name] 1975-07-26  Procedure: Injection Indications: Left knee pain  Procedure Details Consent: Risks of procedure as well as the alternatives and risks of each were explained to the (patient/caregiver).  Consent for procedure obtained. Time Out: Verified patient identification, verified procedure, site/side was marked, verified correct patient position, special equipment/implants available, medications/allergies/relevent history reviewed, required imaging and test results available.  Performed.  The area was cleaned with iodine and alcohol swabs.    The left knee superior lateral suprapatellar approach was injected using 1 cc's of 40 mg Kenalog and 4 cc's of 0.25% bupivacaine with a 21 2" needle.  Ultrasound was used.  Images were obtained in long views showing the injection.     A sterile dressing was applied.  Patient did tolerate procedure well.     ASSESSMENT & PLAN:   Patellofemoral pain syndrome of left knee Pain is ongoing.  He feels like it may get from time to time.  May have tracking issues as well as degenerative changes behind the kneecap. -Counseled on home exercise therapy and supportive care. -Injection today. -Could consider further imaging or physical therapy.  Acute bilateral low back pain with left-sided sciatica May need to consider piriformis syndrome versus posterior hip impingement as the source of some of his radicular symptoms.  May need to address this with an injection in these areas. -Counseled on home exercise therapy and supportive care. -Could consider MRI of the pelvis to look for impingement.  Could consider SI joint injection.

## 2019-10-31 NOTE — Assessment & Plan Note (Signed)
Pain is ongoing.  He feels like it may get from time to time.  May have tracking issues as well as degenerative changes behind the kneecap. -Counseled on home exercise therapy and supportive care. -Injection today. -Could consider further imaging or physical therapy.

## 2019-10-31 NOTE — Assessment & Plan Note (Addendum)
May need to consider piriformis syndrome versus posterior hip impingement as the source of some of his radicular symptoms.  May need to address this with an injection in these areas. -Counseled on home exercise therapy and supportive care. -Could consider MRI of the pelvis to look for impingement.  Could consider SI joint injection.

## 2019-10-31 NOTE — Patient Instructions (Signed)
Good to see you Please try ice  Please try the exercises  Please try the brace   Please send me a message in MyChart with any questions or updates.  Please see me back in 6-8 weeks.   --Dr. Raeford Razor

## 2019-11-14 ENCOUNTER — Encounter (HOSPITAL_BASED_OUTPATIENT_CLINIC_OR_DEPARTMENT_OTHER): Payer: Self-pay | Admitting: Emergency Medicine

## 2019-11-14 ENCOUNTER — Other Ambulatory Visit: Payer: Self-pay

## 2019-11-14 ENCOUNTER — Emergency Department (HOSPITAL_BASED_OUTPATIENT_CLINIC_OR_DEPARTMENT_OTHER)
Admission: EM | Admit: 2019-11-14 | Discharge: 2019-11-14 | Disposition: A | Discharge status: Home / Self Care | Payer: BC Managed Care – PPO | Attending: Emergency Medicine | Admitting: Emergency Medicine

## 2019-11-14 DIAGNOSIS — L600 Ingrowing nail: Secondary | ICD-10-CM

## 2019-11-14 DIAGNOSIS — L03031 Cellulitis of right toe: Secondary | ICD-10-CM | POA: Insufficient documentation

## 2019-11-14 MED ORDER — CEPHALEXIN 500 MG PO CAPS: 500.0000 mg | ORAL_CAPSULE | Freq: Three times a day (TID) | ORAL | 0 refills | Status: AC

## 2019-11-14 NOTE — Discharge Instructions (Signed)
It was wonderful to meet you! Please make sure you complete the course of antibiotics and schedule a follow up visit with the podiatry team for further intervention. You can continue to do epsom salt baths with neosporin. Please avoid constant friction in this region.   We also encourage you to establish with a primary care provider.

## 2019-11-14 NOTE — ED Provider Notes (Signed)
[facility] Provider Note   CSN: [id number] Arrival date & time: 11/14/19  1453     History Chief Complaint  Patient presents with  . Blister    [name] is a 44 y.o. male with a history of elevated BMI, previous PE, and prediabetes, presenting for evaluation of a right toe infection.   Present on right great toe around medial nail fold for the past several months, initially started out as an ingrown toenail.  He has recurrent ingrown toenails predominantly right-sided.  He has been doing Epson salt baths, soaking with hydrogen peroxide, and using Neosporin on the area.  He does feel like since doing this it does actually look better but continues to easily bleed and be tender with lightest palpation.  He has been wearing loose shoes and avoiding socks.  Denies any fever, foot swelling, weakness, decreased sensation in the region.  This is his first time having this evaluated.    Past Medical History:  Diagnosis Date  . Eczema   . Pulmonary emboli [facility])     Patient Active Problem List   Diagnosis Date Noted  . Patellofemoral pain syndrome of left knee 10/02/2019  . Acute bilateral low back pain with left-sided sciatica 06/05/2019  . Neck pain 06/05/2019  . Obesity, Class III, BMI 40-49.9 (morbid obesity) ([place]) 03/15/2017  . Acute pulmonary embolism ([place]) 03/13/2017  . Left ankle injury, initial encounter 02/20/2017  . Low back pain radiating to left leg 01/13/2016  . Low HDL (under 40) 01/13/2015  . Prediabetes 01/13/2015  . Fatigue 08/13/2014  . Hypersomnia 08/13/2014  . Snoring 08/13/2014    Past Surgical History:  Procedure Laterality Date  . IR EPIDUROGRAPHY  12/11/2018  . KNEE SURGERY     right  . LAPAROSCOPIC GASTRIC BAND REMOVAL WITH LAPAROSCOPIC GASTRIC SLEEVE RESECTION    . WRIST FRACTURE SURGERY         Family History  Problem Relation Age of Onset  . Hypertension Other     Social History   Tobacco Use  . Smoking  status: Never Smoker  . Smokeless tobacco: Never Used  Vaping Use  . Vaping Use: Never used  Substance Use Topics  . Alcohol use: No  . Drug use: No    Home Medications Prior to Admission medications   Medication Sig Start Date End Date Taking? Authorizing Provider  celecoxib (CELEBREX) 200 MG capsule One to 2 tablets by mouth daily as needed for pain. 10/02/19   Rosemarie Ax, MD  cephALEXin (KEFLEX) 500 MG capsule Take 1 capsule (500 mg total) by mouth 3 (three) times daily for 5 days. 11/14/19 11/19/19  Patriciaann Clan, DO  CLOBETASOL PROPIONATE E 0.05 % emollient cream APPLY 1 APPLICATION TOPICALLY TWICE DAILY AS NEEDED 02/11/18   [provider]  cyclobenzaprine (FLEXERIL) 10 MG tablet One half tab PO qHS, then increase gradually to one tab TID. 06/05/19   Rosemarie Ax, MD  Multiple Vitamins-Minerals (BARIATRIC MULTIVITAMINS/IRON PO) Take 1 tablet by mouth daily.    [provider]  predniSONE (DELTASONE) 5 MG tablet Take 6 pills for first day, 5 pills second day, 4 pills third day, 3 pills fourth day, 2 pills the fifth day, and 1 pill sixth day. 06/05/19   Rosemarie Ax, MD  traMADol (ULTRAM) 50 MG tablet Take 50 mg by mouth every 6 (six) hours as needed.    [provider]    Allergies    Patient has no known  allergies.  Review of Systems   Review of Systems  Physical Exam Updated Vital Signs BP 130/87 (BP Location: Right Arm)   Pulse 83   Temp 98.4 F (36.9 C) (Oral)   Resp 19   Ht 6\' 4"  (1.93 m)   Wt (!) 190.5 kg   SpO2 100%   BMI 51.12 kg/m   Physical Exam Constitutional:      Appearance: Normal appearance.  HENT:     Head: Normocephalic and atraumatic.  Eyes:     Extraocular Movements: Extraocular movements intact.  Cardiovascular:     Rate and Rhythm: Normal rate and regular rhythm.  Pulmonary:     Effort: Pulmonary effort is normal.  Skin:    General: Skin is warm and dry.     Capillary Refill: Capillary refill takes  less than 2 seconds. Palpable dorsalis pedis pulses bilaterally.    Findings: Lesion present. No rash.     Comments: Pictures of right great toe below.  Thickened, yellow nail with overgrowing, swollen, andfungated appearance of overlying skin on medial nail fold.  Erythema present at skin edge near nailbed.  Easily friable.  Tender to palpation in this region.  Thickened, discolored toenails throughout.  Neurological:     Mental Status: He is alert and oriented to person, place, and time.  Psychiatric:        Mood and Affect: Mood normal.        Behavior: Behavior normal.         ED Results / Procedures / Treatments   Labs (all labs ordered are listed, but only abnormal results are displayed) Labs Reviewed - No data to display  EKG None  Radiology No results found.  Procedures Procedures (including critical care time)  Medications Ordered in ED Medications - No data to display  ED Course  I have reviewed the triage vital signs and the nursing notes.  Pertinent labs & imaging results that were available during my care of the patient were reviewed by me and considered in my medical decision making (see chart for details).    MDM Rules/Calculators/A&P                          44 year old gentleman presenting for evaluation of right great toe, presentation concerning for paronychia due to longstanding ingrown toenail.  Additionally has concurrent onychomycosis bilaterally. Reassuringly afebrile and hemodynamically stable without systemic concerns for infection.  Given extent of overgrowth and injury, recommended evaluation and toenail removal through podiatry.  Rx'd Keflex x 5 days and provided with podiatry referral.  Encouraged continued Epson salt baths and Neosporin as needed, avoid friction in region.  Final Clinical Impression(s) / ED Diagnoses Final diagnoses:  Paronychia of toe of right foot due to ingrown toenail    Rx / DC Orders ED Discharge Orders          Ordered    cephALEXin (KEFLEX) 500 MG capsule  3 times daily     Discontinue  Reprint     11/14/19 [place], [place], DO 11/14/19 1607    Isla Pence, MD 11/14/19 1635

## 2019-11-14 NOTE — ED Triage Notes (Signed)
Blister to R great toe for a few months. States it is getting worse.

## 2019-12-12 ENCOUNTER — Ambulatory Visit: Payer: BC Managed Care – PPO | Admitting: Family Medicine

## 2019-12-12 NOTE — Progress Notes (Deleted)
  [name] - 44 y.o. male MRN [id number]  Date of birth: [birth year]  SUBJECTIVE:  Including CC & ROS.  No chief complaint on file.   [name] is a 44 y.o. male that is  ***.  ***   Review of Systems See HPI   HISTORY: Past Medical, Surgical, Social, and Family History Reviewed & Updated per EMR.   Pertinent Historical Findings include:  Past Medical History:  Diagnosis Date  . Eczema   . Pulmonary emboli [facility])     Past Surgical History:  Procedure Laterality Date  . IR EPIDUROGRAPHY  12/11/2018  . KNEE SURGERY     right  . LAPAROSCOPIC GASTRIC BAND REMOVAL WITH LAPAROSCOPIC GASTRIC SLEEVE RESECTION    . WRIST FRACTURE SURGERY      Family History  Problem Relation Age of Onset  . Hypertension Other     Social History   Socioeconomic History  . Marital status: Married    Spouse name: Not on file  . Number of children: Not on file  . Years of education: Not on file  . Highest education level: Not on file  Occupational History  . Not on file  Tobacco Use  . Smoking status: Never Smoker  . Smokeless tobacco: Never Used  Vaping Use  . Vaping Use: Never used  Substance and Sexual Activity  . Alcohol use: No  . Drug use: No  . Sexual activity: Yes  Other Topics Concern  . Not on file  Social History Narrative  . Not on file   Social Determinants of Health   Financial Resource Strain:   . Difficulty of Paying Living Expenses:   Food Insecurity:   . Worried About Charity fundraiser in the Last Year:   . Arboriculturist in the Last Year:   Transportation Needs:   . Film/video editor (Medical):   Marland Kitchen Lack of Transportation (Non-Medical):   Physical Activity:   . Days of Exercise per Week:   . Minutes of Exercise per Session:   Stress:   . Feeling of Stress :   Social Connections:   . Frequency of Communication with Friends and Family:   . Frequency of Social Gatherings with Friends and Family:   . Attends Religious Services:   . Active  Member of Clubs or Organizations:   . Attends Archivist Meetings:   Marland Kitchen Marital Status:   Intimate Partner Violence:   . Fear of Current or Ex-Partner:   . Emotionally Abused:   Marland Kitchen Physically Abused:   . Sexually Abused:      PHYSICAL EXAM:  VS: There were no vitals taken for this visit. Physical Exam Gen: NAD, alert, cooperative with exam, well-appearing MSK:  ***      ASSESSMENT & PLAN:   No problem-specific Assessment & Plan notes found for this encounter.

## 2020-03-02 ENCOUNTER — Inpatient Hospital Stay: Payer: BC Managed Care – PPO | Admitting: Family

## 2020-03-02 ENCOUNTER — Inpatient Hospital Stay: Payer: BC Managed Care – PPO

## 2020-03-09 ENCOUNTER — Other Ambulatory Visit: Payer: Self-pay

## 2020-03-09 ENCOUNTER — Encounter: Payer: Self-pay | Admitting: Family

## 2020-03-09 ENCOUNTER — Telehealth: Payer: Self-pay | Admitting: Family

## 2020-03-09 ENCOUNTER — Inpatient Hospital Stay (HOSPITAL_BASED_OUTPATIENT_CLINIC_OR_DEPARTMENT_OTHER): Payer: Commercial Managed Care - PPO | Admitting: Family

## 2020-03-09 ENCOUNTER — Inpatient Hospital Stay: Payer: Commercial Managed Care - PPO | Attending: Family

## 2020-03-09 VITALS — BP 128/81 | HR 85 | Temp 98.5°F | Resp 18 | Ht 77.0 in | Wt >= 6400 oz

## 2020-03-09 DIAGNOSIS — D472 Monoclonal gammopathy: Secondary | ICD-10-CM | POA: Diagnosis not present

## 2020-03-09 DIAGNOSIS — R778 Other specified abnormalities of plasma proteins: Secondary | ICD-10-CM | POA: Diagnosis not present

## 2020-03-09 DIAGNOSIS — R5383 Other fatigue: Secondary | ICD-10-CM | POA: Insufficient documentation

## 2020-03-09 LAB — CBC WITH DIFFERENTIAL (CANCER CENTER ONLY)
Abs Immature Granulocytes: 0.04 10*3/uL (ref 0.00–0.07)
Basophils Absolute: 0 10*3/uL (ref 0.0–0.1)
Basophils Relative: 1 %
Eosinophils Absolute: 0.2 10*3/uL (ref 0.0–0.5)
Eosinophils Relative: 3 %
HCT: 45.2 % (ref 39.0–52.0)
Hemoglobin: 14.7 g/dL (ref 13.0–17.0)
Immature Granulocytes: 1 %
Lymphocytes Relative: 31 %
Lymphs Abs: 2.6 10*3/uL (ref 0.7–4.0)
MCH: 28.8 pg (ref 26.0–34.0)
MCHC: 32.5 g/dL (ref 30.0–36.0)
MCV: 88.6 fL (ref 80.0–100.0)
Monocytes Absolute: 0.5 10*3/uL (ref 0.1–1.0)
Monocytes Relative: 6 %
Neutro Abs: 5.1 10*3/uL (ref 1.7–7.7)
Neutrophils Relative %: 58 %
Platelet Count: 259 10*3/uL (ref 150–400)
RBC: 5.1 MIL/uL (ref 4.22–5.81)
RDW: 12.9 % (ref 11.5–15.5)
WBC Count: 8.5 10*3/uL (ref 4.0–10.5)
nRBC: 0 % (ref 0.0–0.2)

## 2020-03-09 LAB — CMP (CANCER CENTER ONLY)
ALT: 12 U/L (ref 0–44)
AST: 14 U/L — ABNORMAL LOW (ref 15–41)
Albumin: 4 g/dL (ref 3.5–5.0)
Alkaline Phosphatase: 60 U/L (ref 38–126)
Anion gap: 8 (ref 5–15)
BUN: 12 mg/dL (ref 6–20)
CO2: 26 mmol/L (ref 22–32)
Calcium: 9.8 mg/dL (ref 8.9–10.3)
Chloride: 105 mmol/L (ref 98–111)
Creatinine: 0.84 mg/dL (ref 0.61–1.24)
GFR, Estimated: >60 mL/min (ref >60)
Glucose, Bld: 118 mg/dL — ABNORMAL HIGH (ref 70–99)
Potassium: 3.9 mmol/L (ref 3.5–5.1)
Sodium: 139 mmol/L (ref 135–145)
Total Bilirubin: 0.8 mg/dL (ref 0.3–1.2)
Total Protein: 7.5 g/dL (ref 6.5–8.1)

## 2020-03-09 NOTE — Progress Notes (Signed)
Hematology and Oncology Follow Up Visit  [name] [id number] 10-May-1976 44 y.o. 03/09/2020   Principle Diagnosis:  MGUS  Current Therapy:        Observation   Interim History:  [name] is here today for follow-up. He is doing well and has no complaints at this time.  He has some occasional fatigue at times after he works a long day.  He denies any issues with frequent or recurrent infections. No fever, chills, n/v, cough, rash, dizziness, SOB, chest pain, palpitations, abdominal pain/bloating or changes in bowel or bladder habits.  No hot flashes or night sweats.  No episodes of blood loss. No bruising or petechiae.  No swelling, tenderness, numbness or tingling in his extremities. No falls or syncopal episodes to report.  He has maintained a good appetite and is staying well hydrated.  ECOG Performance Status: 0 - Asymptomatic  Medications:  Allergies as of 03/09/2020   No Known Allergies     Medication List       Accurate as of March 09, 2020 11:54 AM. If you have any questions, ask your nurse or doctor.        STOP taking these medications   celecoxib 200 MG capsule Commonly known as: CeleBREX Stopped by: Laverna Peace, NP   Clobetasol Propionate E 0.05 % emollient cream Generic drug: Clobetasol Prop Emollient Base Stopped by: Laverna Peace, NP   cyclobenzaprine 10 MG tablet Commonly known as: FLEXERIL Stopped by: Laverna Peace, NP   predniSONE 5 MG tablet Commonly known as: DELTASONE Stopped by: Laverna Peace, NP   traMADol 50 MG tablet Commonly known as: ULTRAM Stopped by: Laverna Peace, NP     TAKE these medications   BARIATRIC MULTIVITAMINS/IRON PO Take 1 tablet by mouth daily.       Allergies: No Known Allergies  Past Medical History, Surgical history, Social history, and Family History were reviewed and updated.  Review of Systems: All other 10 point review of systems is negative.   Physical Exam:  height is  6\' 5"  (1.956 m) and weight is 453 lb 1.9 oz (205.5 kg) (abnormal). His oral temperature is 98.5 F (36.9 C). His blood pressure is 128/81 and his pulse is 85. His respiration is 18 and oxygen saturation is 100%.   Wt Readings from Last 3 Encounters:  03/09/20 (!) 453 lb 1.9 oz (205.5 kg)  11/14/19 (!) 420 lb (190.5 kg)  10/31/19 (!) 430 lb (195 kg)    Ocular: Sclerae unicteric, pupils equal, round and reactive to light Ear-nose-throat: Oropharynx clear, dentition fair Lymphatic: No cervical or supraclavicular adenopathy Lungs no rales or rhonchi, good excursion bilaterally Heart regular rate and rhythm, no murmur appreciated Abd soft, nontender, positive bowel sounds, no liver or spleen tip palpated on exam, no fluid wave  MSK no focal spinal tenderness, no joint edema Neuro: non-focal, well-oriented, appropriate affect Breasts: Deferred   Lab Results  Component Value Date   WBC 8.5 03/09/2020   HGB 14.7 03/09/2020   HCT 45.2 03/09/2020   MCV 88.6 03/09/2020   PLT 259 03/09/2020   No results found for: FERRITIN, IRON, TIBC, UIBC, IRONPCTSAT Lab Results  Component Value Date   RBC 5.10 03/09/2020   Lab Results  Component Value Date   KPAFRELGTCHN 23.5 (H) 07/04/2019   LAMBDASER 22.3 07/04/2019   KAPLAMBRATIO 1.05 07/04/2019   Lab Results  Component Value Date   IGGSERUM 1,373 07/04/2019   IGGSERUM 1,713 (H) 07/04/2019   IGA 465 (H) 07/04/2019   IGA  558 (H) 07/04/2019   IGMSERUM 58 07/04/2019   IGMSERUM 63 07/04/2019   Lab Results  Component Value Date   TOTALPROTELP 7.4 07/04/2019   ALBUMINELP 3.8 07/04/2019   A1GS 0.2 07/04/2019   A2GS 0.7 07/04/2019   BETS 1.3 07/04/2019   GAMS 1.4 07/04/2019   MSPIKE 0.5 (H) 07/04/2019   SPEI Comment 04/03/2019     Chemistry      Component Value Date/Time   NA 139 03/09/2020 1114   NA 139 03/07/2019 1100   K 3.9 03/09/2020 1114   CL 105 03/09/2020 1114   CO2 26 03/09/2020 1114   BUN 12 03/09/2020 1114   BUN 9  03/07/2019 1100   CREATININE 0.84 03/09/2020 1114      Component Value Date/Time   CALCIUM 9.8 03/09/2020 1114   ALKPHOS 60 03/09/2020 1114   AST 14 (L) 03/09/2020 1114   ALT 12 03/09/2020 1114   BILITOT 0.8 03/09/2020 1114       Impression and Plan: [name] is a pleasant 44 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  He remains asymptomatic and has no complaints at this time.  Protein studies were redrawn today and results are pending.  Follow-up in 8 months.  He can contact our office with any questions or concerns.   Laverna Peace, NP 10/11/202111:54 AM

## 2020-03-09 NOTE — Telephone Encounter (Signed)
Appointments scheduled calendar printed & mailed per 10/11 los

## 2020-03-10 LAB — IGG, IGA, IGM
IgA: 503 mg/dL — ABNORMAL HIGH (ref 90–386)
IgG (Immunoglobin G), Serum: 1474 mg/dL (ref 603–1613)
IgM (Immunoglobulin M), Srm: 54 mg/dL (ref 20–172)

## 2020-03-10 LAB — KAPPA/LAMBDA LIGHT CHAINS
Kappa free light chain: 28.9 mg/L — ABNORMAL HIGH (ref 3.3–19.4)
Kappa, lambda light chain ratio: 1.55 (ref 0.26–1.65)
Lambda free light chains: 18.6 mg/L (ref 5.7–26.3)

## 2020-03-12 LAB — PROTEIN ELECTROPHORESIS, SERUM, WITH REFLEX
A/G Ratio: 0.9 (ref 0.7–1.7)
Albumin ELP: 3.4 g/dL (ref 2.9–4.4)
Alpha-1-Globulin: 0.2 g/dL (ref 0.0–0.4)
Alpha-2-Globulin: 0.8 g/dL (ref 0.4–1.0)
Beta Globulin: 1.4 g/dL — ABNORMAL HIGH (ref 0.7–1.3)
Gamma Globulin: 1.5 g/dL (ref 0.4–1.8)
Globulin, Total: 3.8 g/dL (ref 2.2–3.9)
M-Spike, %: 0.4 g/dL — ABNORMAL HIGH
SPEP Interpretation: 0
Total Protein ELP: 7.2 g/dL (ref 6.0–8.5)

## 2020-03-12 LAB — IMMUNOFIXATION REFLEX, SERUM
IgA: 528 mg/dL — ABNORMAL HIGH (ref 90–386)
IgG (Immunoglobin G), Serum: 1598 mg/dL (ref 603–1613)
IgM (Immunoglobulin M), Srm: 56 mg/dL (ref 20–172)

## 2020-03-16 ENCOUNTER — Ambulatory Visit: Payer: Commercial Managed Care - PPO | Admitting: Family Medicine

## 2020-03-16 NOTE — Progress Notes (Deleted)
  [name] - 45 y.o. male MRN [id number]  Date of birth: Nov 02, 1975  SUBJECTIVE:  Including CC & ROS.  No chief complaint on file.   [name] is a 44 y.o. male that is  ***.  ***   Review of Systems See HPI   HISTORY: Past Medical, Surgical, Social, and Family History Reviewed & Updated per EMR.   Pertinent Historical Findings include:  Past Medical History:  Diagnosis Date  . Eczema   . Pulmonary emboli [facility])     Past Surgical History:  Procedure Laterality Date  . IR EPIDUROGRAPHY  12/11/2018  . KNEE SURGERY     right  . LAPAROSCOPIC GASTRIC BAND REMOVAL WITH LAPAROSCOPIC GASTRIC SLEEVE RESECTION    . WRIST FRACTURE SURGERY      Family History  Problem Relation Age of Onset  . Hypertension Other     Social History   Socioeconomic History  . Marital status: Married    Spouse name: Not on file  . Number of children: Not on file  . Years of education: Not on file  . Highest education level: Not on file  Occupational History  . Not on file  Tobacco Use  . Smoking status: Never Smoker  . Smokeless tobacco: Never Used  Vaping Use  . Vaping Use: Never used  Substance and Sexual Activity  . Alcohol use: No  . Drug use: No  . Sexual activity: Yes  Other Topics Concern  . Not on file  Social History Narrative  . Not on file   Social Determinants of Health   Financial Resource Strain:   . Difficulty of Paying Living Expenses: Not on file  Food Insecurity:   . Worried About Charity fundraiser in the Last Year: Not on file  . Ran Out of Food in the Last Year: Not on file  Transportation Needs:   . Lack of Transportation (Medical): Not on file  . Lack of Transportation (Non-Medical): Not on file  Physical Activity:   . Days of Exercise per Week: Not on file  . Minutes of Exercise per Session: Not on file  Stress:   . Feeling of Stress : Not on file  Social Connections:   . Frequency of Communication with Friends and Family: Not on file  .  Frequency of Social Gatherings with Friends and Family: Not on file  . Attends Religious Services: Not on file  . Active Member of Clubs or Organizations: Not on file  . Attends Archivist Meetings: Not on file  . Marital Status: Not on file  Intimate Partner Violence:   . Fear of Current or Ex-Partner: Not on file  . Emotionally Abused: Not on file  . Physically Abused: Not on file  . Sexually Abused: Not on file     PHYSICAL EXAM:  VS: There were no vitals taken for this visit. Physical Exam Gen: NAD, alert, cooperative with exam, well-appearing MSK:  ***      ASSESSMENT & PLAN:   No problem-specific Assessment & Plan notes found for this encounter.

## 2020-03-17 ENCOUNTER — Other Ambulatory Visit: Payer: Self-pay

## 2020-03-17 ENCOUNTER — Encounter: Payer: Self-pay | Admitting: Family Medicine

## 2020-03-17 ENCOUNTER — Ambulatory Visit (INDEPENDENT_AMBULATORY_CARE_PROVIDER_SITE_OTHER): Payer: Commercial Managed Care - PPO | Admitting: Family Medicine

## 2020-03-17 VITALS — BP 143/86 | HR 71 | Ht 77.0 in | Wt >= 6400 oz

## 2020-03-17 DIAGNOSIS — M7712 Lateral epicondylitis, left elbow: Secondary | ICD-10-CM | POA: Diagnosis not present

## 2020-03-17 MED ORDER — PREDNISONE 5 MG PO TABS: ORAL_TABLET | ORAL | 0 refills | Status: DC

## 2020-03-17 NOTE — Progress Notes (Signed)
Medication Samples have been provided to the patient.  Drug name: Pennsaid       Strength: 2%        Qty: 2 boxes  LOT: [id number]  Exp.Date: 07/2020  Dosing instructions: Use a pea size amount and rub gently.  The patient has been instructed regarding the correct time, dose, and frequency of taking this medication, including desired effects and most common side effects.   Sherrie George, MA 10:41 AM 03/17/2020

## 2020-03-17 NOTE — Patient Instructions (Signed)
Good to see you Please try ice  Please try the prednisone  Please try the rub on medicine after the prednisone  Please try the exercises  You can try compression   Please send me a message in [place] with any questions or updates.  Please see me back in 4-6 weeks.   --Dr. Raeford Razor

## 2020-03-17 NOTE — Progress Notes (Signed)
[name] - 44 y.o. male MRN [id number]  Date of birth: [birth year]  SUBJECTIVE:  Including CC & ROS.  Chief Complaint  Patient presents with  . Elbow Pain    left x 2 weeks    [name] is a 44 y.o. male that is presenting with left elbow pain.  The pain is localized over the lateral epicondyle.  Denies any injury.  He does repetitive motions with his job.  No history of surgery.  No history of similar pain.  Seems to be staying the same.   Review of Systems See HPI   HISTORY: Past Medical, Surgical, Social, and Family History Reviewed & Updated per EMR.   Pertinent Historical Findings include:  Past Medical History:  Diagnosis Date  . Eczema   . Pulmonary emboli [facility])     Past Surgical History:  Procedure Laterality Date  . IR EPIDUROGRAPHY  12/11/2018  . KNEE SURGERY     right  . LAPAROSCOPIC GASTRIC BAND REMOVAL WITH LAPAROSCOPIC GASTRIC SLEEVE RESECTION    . WRIST FRACTURE SURGERY      Family History  Problem Relation Age of Onset  . Hypertension Other     Social History   Socioeconomic History  . Marital status: Married    Spouse name: Not on file  . Number of children: Not on file  . Years of education: Not on file  . Highest education level: Not on file  Occupational History  . Not on file  Tobacco Use  . Smoking status: Never Smoker  . Smokeless tobacco: Never Used  Vaping Use  . Vaping Use: Never used  Substance and Sexual Activity  . Alcohol use: No  . Drug use: No  . Sexual activity: Yes  Other Topics Concern  . Not on file  Social History Narrative  . Not on file   Social Determinants of Health   Financial Resource Strain:   . Difficulty of Paying Living Expenses: Not on file  Food Insecurity:   . Worried About Charity fundraiser in the Last Year: Not on file  . Ran Out of Food in the Last Year: Not on file  Transportation Needs:   . Lack of Transportation (Medical): Not on file  . Lack of Transportation (Non-Medical): Not on  file  Physical Activity:   . Days of Exercise per Week: Not on file  . Minutes of Exercise per Session: Not on file  Stress:   . Feeling of Stress : Not on file  Social Connections:   . Frequency of Communication with Friends and Family: Not on file  . Frequency of Social Gatherings with Friends and Family: Not on file  . Attends Religious Services: Not on file  . Active Member of Clubs or Organizations: Not on file  . Attends Archivist Meetings: Not on file  . Marital Status: Not on file  Intimate Partner Violence:   . Fear of Current or Ex-Partner: Not on file  . Emotionally Abused: Not on file  . Physically Abused: Not on file  . Sexually Abused: Not on file     PHYSICAL EXAM:  VS: BP (!) 143/86   Pulse 71   Ht 6\' 5"  (1.956 m)   Wt (!) 420 lb (190.5 kg)   BMI 49.80 kg/m  Physical Exam Gen: NAD, alert, cooperative with exam, well-appearing MSK:  Left elbow: Tenderness palpation over the lateral condyle. Normal elbow range of motion. No swelling or ecchymosis. Pain exacerbated with resistance to wrist  extension. Pain with middle finger extension resistance. Neurovascularly intact     ASSESSMENT & PLAN:   Lateral epicondylitis of left elbow Symptoms consistent with lateral epicondylitis.  Does repetitive motions at work. -Counseled on home exercise therapy and supportive care. -Prednisone. -Provided Pennsaid samples. -Could consider physical therapy or injection.

## 2020-03-18 DIAGNOSIS — M7712 Lateral epicondylitis, left elbow: Secondary | ICD-10-CM | POA: Insufficient documentation

## 2020-03-18 NOTE — Assessment & Plan Note (Signed)
Symptoms consistent with lateral epicondylitis.  Does repetitive motions at work. -Counseled on home exercise therapy and supportive care. -Prednisone. -Provided Pennsaid samples. -Could consider physical therapy or injection.

## 2020-04-28 ENCOUNTER — Ambulatory Visit: Payer: Commercial Managed Care - PPO | Admitting: Family Medicine

## 2020-04-28 NOTE — Progress Notes (Deleted)
  [name] - 44 y.o. male MRN [id number]  Date of birth: 03-Mar-1976  SUBJECTIVE:  Including CC & ROS.  No chief complaint on file.   [name] is a 44 y.o. male that is  ***.  ***   Review of Systems See HPI   HISTORY: Past Medical, Surgical, Social, and Family History Reviewed & Updated per EMR.   Pertinent Historical Findings include:  Past Medical History:  Diagnosis Date  . Eczema   . Pulmonary emboli [facility])     Past Surgical History:  Procedure Laterality Date  . IR EPIDUROGRAPHY  12/11/2018  . KNEE SURGERY     right  . LAPAROSCOPIC GASTRIC BAND REMOVAL WITH LAPAROSCOPIC GASTRIC SLEEVE RESECTION    . WRIST FRACTURE SURGERY      Family History  Problem Relation Age of Onset  . Hypertension Other     Social History   Socioeconomic History  . Marital status: Married    Spouse name: Not on file  . Number of children: Not on file  . Years of education: Not on file  . Highest education level: Not on file  Occupational History  . Not on file  Tobacco Use  . Smoking status: Never Smoker  . Smokeless tobacco: Never Used  Vaping Use  . Vaping Use: Never used  Substance and Sexual Activity  . Alcohol use: No  . Drug use: No  . Sexual activity: Yes  Other Topics Concern  . Not on file  Social History Narrative  . Not on file   Social Determinants of Health   Financial Resource Strain:   . Difficulty of Paying Living Expenses: Not on file  Food Insecurity:   . Worried About Charity fundraiser in the Last Year: Not on file  . Ran Out of Food in the Last Year: Not on file  Transportation Needs:   . Lack of Transportation (Medical): Not on file  . Lack of Transportation (Non-Medical): Not on file  Physical Activity:   . Days of Exercise per Week: Not on file  . Minutes of Exercise per Session: Not on file  Stress:   . Feeling of Stress : Not on file  Social Connections:   . Frequency of Communication with Friends and Family: Not on file  .  Frequency of Social Gatherings with Friends and Family: Not on file  . Attends Religious Services: Not on file  . Active Member of Clubs or Organizations: Not on file  . Attends Archivist Meetings: Not on file  . Marital Status: Not on file  Intimate Partner Violence:   . Fear of Current or Ex-Partner: Not on file  . Emotionally Abused: Not on file  . Physically Abused: Not on file  . Sexually Abused: Not on file     PHYSICAL EXAM:  VS: There were no vitals taken for this visit. Physical Exam Gen: NAD, alert, cooperative with exam, well-appearing MSK:  ***      ASSESSMENT & PLAN:   No problem-specific Assessment & Plan notes found for this encounter.

## 2020-05-13 ENCOUNTER — Ambulatory Visit: Payer: BC Managed Care – PPO | Admitting: Family Medicine

## 2020-05-20 ENCOUNTER — Telehealth: Payer: Self-pay | Admitting: Family Medicine

## 2020-05-20 NOTE — Telephone Encounter (Signed)
Called pt to rescheduled appt from 12/15--cancelled due to provider being [place] [employer]--glh  --No answer message left. --glh

## 2020-06-26 ENCOUNTER — Other Ambulatory Visit: Payer: Self-pay

## 2020-06-26 ENCOUNTER — Ambulatory Visit (INDEPENDENT_AMBULATORY_CARE_PROVIDER_SITE_OTHER): Payer: Commercial Managed Care - PPO | Admitting: Podiatry

## 2020-06-26 DIAGNOSIS — L600 Ingrowing nail: Secondary | ICD-10-CM | POA: Diagnosis not present

## 2020-06-26 DIAGNOSIS — M79676 Pain in unspecified toe(s): Secondary | ICD-10-CM

## 2020-06-26 NOTE — Progress Notes (Signed)
  Subjective:  Patient ID: [name], male    DOB: 1975-08-27,  MRN: [id number]  No chief complaint on file.  45 y.o. male presents for right foot ingrown toenail. Has had issues with this nail since childhood. States the nail has been hurting for several weeks, has been soaking and applying peroxide. Has hx of pus drainage.  Objective:  Physical Exam: warm, good capillary refill, no trophic changes or ulcerative lesions, normal DP and PT pulses and normal sensory exam.  Painful ingrowing nail at lateral border of the right, hallux; without warmth, erythema or drainage  Assessment:   1. Ingrown nail   2. Pain around toenail    Plan:  Patient was evaluated and treated and all questions answered.  Ingrown Nail, right  -Offered simple slant back vs partial permanent excision. Patient preferred former -Nail debrided in slant back fashion -Dressed with abx ointment and band-aid -Advised to soak in water and epsom salt until healed.  Return if symptoms worsen or fail to improve.

## 2020-08-21 ENCOUNTER — Ambulatory Visit: Payer: Commercial Managed Care - PPO | Admitting: Podiatry

## 2020-08-21 ENCOUNTER — Other Ambulatory Visit: Payer: Self-pay

## 2020-08-21 DIAGNOSIS — M79676 Pain in unspecified toe(s): Secondary | ICD-10-CM

## 2020-08-21 DIAGNOSIS — L600 Ingrowing nail: Secondary | ICD-10-CM | POA: Diagnosis not present

## 2020-08-21 DIAGNOSIS — M545 Low back pain, unspecified: Secondary | ICD-10-CM | POA: Insufficient documentation

## 2020-08-21 NOTE — Patient Instructions (Signed)

## 2020-09-04 ENCOUNTER — Other Ambulatory Visit: Payer: Self-pay

## 2020-09-04 ENCOUNTER — Ambulatory Visit (INDEPENDENT_AMBULATORY_CARE_PROVIDER_SITE_OTHER): Payer: Commercial Managed Care - PPO | Admitting: Podiatry

## 2020-09-04 DIAGNOSIS — M79676 Pain in unspecified toe(s): Secondary | ICD-10-CM

## 2020-09-04 DIAGNOSIS — L600 Ingrowing nail: Secondary | ICD-10-CM | POA: Diagnosis not present

## 2020-09-04 NOTE — Progress Notes (Signed)
  Subjective:  Patient ID: [name], male    DOB: 1975/11/18,  MRN: [id number]  Chief Complaint  Patient presents with  . Ingrown Toenail    Nail check right   45 y.o. male presents for follow up of nail procedure. History confirmed with patient. States the toe only hurts when he wears his boots at work. Otherwise it is doing ok.  Objective:  Physical Exam: Ingrown nail avulsion site: overlying soft crust, no warmth, no drainage and no erythema Assessment:   1. Ingrown nail   2. Pain around toenail     Plan:  Patient was evaluated and treated and all questions answered.  S/p Ingrown Toenail Excision, right -Healing well without issue. -Discussed return precautions. -F/u PRN

## 2020-09-04 NOTE — Progress Notes (Signed)
  Subjective:  Patient ID: [name], male    DOB: [birth year],  MRN: [id number]  Chief Complaint  Patient presents with  . Nail Problem    Right 1st toenail lateral border painful 3 weeks duration, hx of ingrown removal.    45 y.o. male presents with the above complaint. History confirmed with patient.   Objective:  Physical Exam: warm, good capillary refill, no trophic changes or ulcerative lesions, normal DP and PT pulses and normal sensory exam.  Painful ingrowing nail at lateral border of the right, hallux; local warmth noted  Assessment:   1. Ingrown nail   2. Pain around toenail    Plan:  Patient was evaluated and treated and all questions answered.  Ingrown Nail, right -Patient elects to proceed with ingrown toenail removal today -Ingrown nail excised. See procedure note. -Educated on post-procedure care including soaking. Written instructions provided. -Patient to follow up in 2 weeks for nail check.  Procedure: Excision of Ingrown Toenail Location: Right 1st toe lateral border Anesthesia: Lidocaine 1% plain; 1.73mL and Marcaine 0.5% plain; 1.36mL, digital block. Skin Prep: Betadine. Dressing: Silvadene; telfa; dry, sterile, compression dressing. Technique: Following skin prep, the toe was exsanguinated and a tourniquet was secured at the base of the toe. The affected nail border was freed, split with a nail splitter, and excised. Chemical matrixectomy was then performed with phenol and irrigated out with alcohol. The tourniquet was then removed and sterile dressing applied. Disposition: Patient tolerated procedure well. Patient to return in 2 weeks for follow-up.

## 2020-10-20 ENCOUNTER — Ambulatory Visit: Payer: Commercial Managed Care - PPO | Admitting: Family

## 2020-11-06 ENCOUNTER — Other Ambulatory Visit: Payer: Commercial Managed Care - PPO

## 2020-11-06 ENCOUNTER — Ambulatory Visit: Payer: Commercial Managed Care - PPO | Admitting: Hematology & Oncology

## 2020-11-17 ENCOUNTER — Telehealth: Payer: Self-pay

## 2020-12-03 ENCOUNTER — Ambulatory Visit: Payer: Commercial Managed Care - PPO | Admitting: Family Medicine

## 2020-12-08 ENCOUNTER — Ambulatory Visit (INDEPENDENT_AMBULATORY_CARE_PROVIDER_SITE_OTHER): Payer: BLUE CROSS/BLUE SHIELD | Admitting: Podiatry

## 2020-12-08 ENCOUNTER — Other Ambulatory Visit: Payer: Self-pay

## 2020-12-08 DIAGNOSIS — L600 Ingrowing nail: Secondary | ICD-10-CM

## 2020-12-08 DIAGNOSIS — M79676 Pain in unspecified toe(s): Secondary | ICD-10-CM

## 2020-12-08 NOTE — Progress Notes (Signed)
  Subjective:  Patient ID: [name], male    DOB: 03/23/1976,  MRN: [id number]  Chief Complaint  Patient presents with   Ingrown Toenail    Right great ingrown has grown back. Pt had removal 3 months ago.    45 y.o. male presents with the above complaint. History confirmed with patient.   Objective:  Physical Exam: warm, good capillary refill, no trophic changes or ulcerative lesions, normal DP and PT pulses, and normal sensory exam.  Painful ingrowing nail at both borders of the right, hallux; without warmth, erythema or drainage  Assessment:   1. Ingrown nail   2. Pain around toenail    Plan:  Patient was evaluated and treated and all questions answered.  Ingrown Nail, right -Palliative debridement of ingrowing nails in slant-back fashion to patient relief. -Should issues persist consider permanent total nail excision.

## 2020-12-18 ENCOUNTER — Encounter: Payer: Self-pay | Admitting: Hematology & Oncology

## 2020-12-18 ENCOUNTER — Inpatient Hospital Stay (HOSPITAL_BASED_OUTPATIENT_CLINIC_OR_DEPARTMENT_OTHER): Payer: BLUE CROSS/BLUE SHIELD | Admitting: Hematology & Oncology

## 2020-12-18 ENCOUNTER — Telehealth: Payer: Self-pay

## 2020-12-18 ENCOUNTER — Other Ambulatory Visit: Payer: Self-pay

## 2020-12-18 ENCOUNTER — Inpatient Hospital Stay: Payer: BLUE CROSS/BLUE SHIELD | Attending: Hematology & Oncology

## 2020-12-18 VITALS — BP 140/82 | HR 64 | Temp 98.6°F | Resp 20 | Wt >= 6400 oz

## 2020-12-18 DIAGNOSIS — D472 Monoclonal gammopathy: Secondary | ICD-10-CM | POA: Insufficient documentation

## 2020-12-18 DIAGNOSIS — R778 Other specified abnormalities of plasma proteins: Secondary | ICD-10-CM

## 2020-12-18 DIAGNOSIS — Z7189 Other specified counseling: Secondary | ICD-10-CM

## 2020-12-18 HISTORY — DX: Other specified counseling: Z71.89

## 2020-12-18 HISTORY — DX: Monoclonal gammopathy: D47.2

## 2020-12-18 LAB — CBC WITH DIFFERENTIAL (CANCER CENTER ONLY)
Abs Immature Granulocytes: 0.03 10*3/uL (ref 0.00–0.07)
Basophils Absolute: 0 10*3/uL (ref 0.0–0.1)
Basophils Relative: 1 %
Eosinophils Absolute: 0.3 10*3/uL (ref 0.0–0.5)
Eosinophils Relative: 4 %
HCT: 42.9 % (ref 39.0–52.0)
Hemoglobin: 14.1 g/dL (ref 13.0–17.0)
Immature Granulocytes: 0 %
Lymphocytes Relative: 30 %
Lymphs Abs: 2.2 10*3/uL (ref 0.7–4.0)
MCH: 29.3 pg (ref 26.0–34.0)
MCHC: 32.9 g/dL (ref 30.0–36.0)
MCV: 89 fL (ref 80.0–100.0)
Monocytes Absolute: 0.6 10*3/uL (ref 0.1–1.0)
Monocytes Relative: 8 %
Neutro Abs: 4.3 10*3/uL (ref 1.7–7.7)
Neutrophils Relative %: 57 %
Platelet Count: 276 10*3/uL (ref 150–400)
RBC: 4.82 MIL/uL (ref 4.22–5.81)
RDW: 13.2 % (ref 11.5–15.5)
WBC Count: 7.5 10*3/uL (ref 4.0–10.5)
nRBC: 0 % (ref 0.0–0.2)

## 2020-12-18 LAB — CMP (CANCER CENTER ONLY)
ALT: 15 U/L (ref 0–44)
AST: 13 U/L — ABNORMAL LOW (ref 15–41)
Albumin: 3.9 g/dL (ref 3.5–5.0)
Alkaline Phosphatase: 68 U/L (ref 38–126)
Anion gap: 7 (ref 5–15)
BUN: 11 mg/dL (ref 6–20)
CO2: 27 mmol/L (ref 22–32)
Calcium: 9.3 mg/dL (ref 8.9–10.3)
Chloride: 105 mmol/L (ref 98–111)
Creatinine: 0.9 mg/dL (ref 0.61–1.24)
GFR, Estimated: >60 mL/min (ref >60)
Glucose, Bld: 107 mg/dL — ABNORMAL HIGH (ref 70–99)
Potassium: 3.9 mmol/L (ref 3.5–5.1)
Sodium: 139 mmol/L (ref 135–145)
Total Bilirubin: 0.8 mg/dL (ref 0.3–1.2)
Total Protein: 7.3 g/dL (ref 6.5–8.1)

## 2020-12-18 NOTE — Telephone Encounter (Signed)
Appts made per 12/18/20 los and pt place in his phone  [name]

## 2020-12-18 NOTE — Progress Notes (Signed)
Hematology and Oncology Follow Up Visit  [name] [id number] Jul 26, 1975 45 y.o. 12/18/2020   Principle Diagnosis:  IgG kappa MGUS   Current Therapy:        Observation   Interim History:  [name] is here today for follow-up.  This is the first time that I am seeing him.  He has been seen by Judson Roch so far.  He is incredibly interesting.  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  He has go to work today.  Today is his birthday.  I just feel bad that he has to work on his birthday.  When he was last seen back in October 2021, his M spike was 0.4 g/dL.  The IgG level was 1520 mg/dL.  The Kappa light chain was 3 mg/dL.  He says he has some numbness in his hands on occasion.  I suspect this is probably more anatomic than anything else.  He has had no change in bowel or bladder habits.  He has had no problem with infections.  He has had no issues with rashes.  He has had no problems with nausea or vomiting.  Overall, his performance status is ECOG 0.    Medications:  Allergies as of 12/18/2020   No Known Allergies      Medication List        Accurate as of December 18, 2020  9:42 AM. If you have any questions, ask your nurse or doctor.          STOP taking these medications    loratadine 10 MG tablet Commonly known as: CLARITIN Stopped by: Volanda Napoleon, MD   montelukast 10 MG tablet Commonly known as: SINGULAIR Stopped by: Volanda Napoleon, MD   naproxen sodium 220 MG tablet Commonly known as: ALEVE Stopped by: Volanda Napoleon, MD   predniSONE 5 MG tablet Commonly known as: DELTASONE Stopped by: Volanda Napoleon, MD   traMADol 50 MG tablet Commonly known as: ULTRAM Stopped by: Volanda Napoleon, MD       TAKE these medications    acetaminophen 500 MG tablet Commonly known as: TYLENOL Take 500 mg by mouth every 6 (six) hours as needed.   BARIATRIC MULTIVITAMINS/IRON PO Take 1 tablet by mouth daily.   fluticasone 50 MCG/ACT nasal  spray Commonly known as: FLONASE Place 2 sprays into both nostrils daily.   sildenafil 50 MG tablet Commonly known as: VIAGRA Take 50 mg by mouth as needed.   triamcinolone cream 0.1 % Commonly known as: KENALOG Apply topically.   Vitamin D3 1.25 MG (50000 UT) Caps Take 1 capsule by mouth once a week.        Allergies: No Known Allergies  Past Medical History, Surgical history, Social history, and Family History were reviewed and updated.  Review of Systems: Review of Systems  Constitutional: Negative.   HENT: Negative.    Eyes: Negative.   Respiratory: Negative.    Cardiovascular: Negative.   Gastrointestinal: Negative.   Genitourinary: Negative.   Musculoskeletal: Negative.   Skin: Negative.   Neurological:  Positive for tingling.  Endo/Heme/Allergies: Negative.   Psychiatric/Behavioral: Negative.      Physical Exam:  weight is 460 lb (208.7 kg) (abnormal). His oral temperature is 98.6 F (37 C). His blood pressure is 140/82 and his pulse is 64. His respiration is 20 and oxygen saturation is 100%.   Wt Readings from Last 3 Encounters:  12/18/20 (!) 460 lb (208.7 kg)  03/17/20 Marland Kitchen)  420 lb (190.5 kg)  03/09/20 (!) 453 lb 1.9 oz (205.5 kg)    Ocular: Sclerae unicteric, pupils equal, round and reactive to light Ear-nose-throat: Oropharynx clear, dentition fair Lymphatic: No cervical or supraclavicular adenopathy Lungs no rales or rhonchi, good excursion bilaterally Heart regular rate and rhythm, no murmur appreciated Abd soft, nontender, positive bowel sounds, no liver or spleen tip palpated on exam, no fluid wave  MSK no focal spinal tenderness, no joint edema Neuro: non-focal, well-oriented, appropriate affect Breasts: Deferred   Lab Results  Component Value Date   WBC 7.5 12/18/2020   HGB 14.1 12/18/2020   HCT 42.9 12/18/2020   MCV 89.0 12/18/2020   PLT 276 12/18/2020   No results found for: FERRITIN, IRON, TIBC, UIBC, IRONPCTSAT Lab Results   Component Value Date   RBC 4.82 12/18/2020   Lab Results  Component Value Date   KPAFRELGTCHN 28.9 (H) 03/09/2020   LAMBDASER 18.6 03/09/2020   KAPLAMBRATIO 1.55 03/09/2020   Lab Results  Component Value Date   IGGSERUM 1,474 03/09/2020   IGGSERUM 1,598 03/09/2020   IGA 503 (H) 03/09/2020   IGA 528 (H) 03/09/2020   IGMSERUM 54 03/09/2020   IGMSERUM 56 03/09/2020   Lab Results  Component Value Date   TOTALPROTELP 7.2 03/09/2020   ALBUMINELP 3.4 03/09/2020   A1GS 0.2 03/09/2020   A2GS 0.8 03/09/2020   BETS 1.4 (H) 03/09/2020   GAMS 1.5 03/09/2020   MSPIKE 0.4 (H) 03/09/2020   SPEI Comment 04/03/2019     Chemistry      Component Value Date/Time   NA 139 12/18/2020 0838   NA 139 03/07/2019 1100   K 3.9 12/18/2020 0838   CL 105 12/18/2020 0838   CO2 27 12/18/2020 0838   BUN 11 12/18/2020 0838   BUN 9 03/07/2019 1100   CREATININE 0.90 12/18/2020 0838      Component Value Date/Time   CALCIUM 9.3 12/18/2020 0838   ALKPHOS 68 12/18/2020 0838   AST 13 (L) 12/18/2020 0838   ALT 15 12/18/2020 0838   BILITOT 0.8 12/18/2020 0838       Impression and Plan: [name] is a pleasant 45yo African American gentleman with an IgG kappa MGUS, 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   I just do not think that this is going be a problem for him.  I forgot to mention that he is a big [employer].  He and I talked quite a bit about this.  Again, he is incredibly interesting.  He is morbidly obese.  I think this can be his overall health issue down the road.  We will plan to see him back in another 6 months.    Volanda Napoleon, MD 7/22/20229:42 AM

## 2020-12-19 LAB — IGG, IGA, IGM
IgA: 440 mg/dL — ABNORMAL HIGH (ref 90–386)
IgG (Immunoglobin G), Serum: 1408 mg/dL (ref 603–1613)
IgM (Immunoglobulin M), Srm: 52 mg/dL (ref 20–172)

## 2020-12-21 LAB — PROTEIN ELECTROPHORESIS, SERUM
A/G Ratio: 1.1 (ref 0.7–1.7)
Albumin ELP: 3.6 g/dL (ref 2.9–4.4)
Alpha-1-Globulin: 0.2 g/dL (ref 0.0–0.4)
Alpha-2-Globulin: 0.6 g/dL (ref 0.4–1.0)
Beta Globulin: 1.3 g/dL (ref 0.7–1.3)
Gamma Globulin: 1.2 g/dL (ref 0.4–1.8)
Globulin, Total: 3.3 g/dL (ref 2.2–3.9)
M-Spike, %: 0.5 g/dL — ABNORMAL HIGH
Total Protein ELP: 6.9 g/dL (ref 6.0–8.5)

## 2020-12-21 LAB — KAPPA/LAMBDA LIGHT CHAINS
Kappa free light chain: 23 mg/L — ABNORMAL HIGH (ref 3.3–19.4)
Kappa, lambda light chain ratio: 1.31 (ref 0.26–1.65)
Lambda free light chains: 17.6 mg/L (ref 5.7–26.3)

## 2021-01-12 ENCOUNTER — Ambulatory Visit: Payer: Commercial Managed Care - PPO | Admitting: Family Medicine

## 2021-01-12 NOTE — Progress Notes (Deleted)
  [name] - 45 y.o. male MRN [id number]  Date of birth: February 05, 1976  SUBJECTIVE:  Including CC & ROS.  No chief complaint on file.   [name] is a 45 y.o. male that is  ***.  ***   Review of Systems See HPI   HISTORY: Past Medical, Surgical, Social, and Family History Reviewed & Updated per EMR.   Pertinent Historical Findings include:  Past Medical History:  Diagnosis Date   Eczema    Goals of care, counseling/discussion 12/18/2020   MGUS (monoclonal gammopathy of unknown significance) 12/18/2020   Pulmonary emboli [facility])     Past Surgical History:  Procedure Laterality Date   IR EPIDUROGRAPHY  12/11/2018   KNEE SURGERY     right   LAPAROSCOPIC GASTRIC BAND REMOVAL WITH LAPAROSCOPIC GASTRIC SLEEVE RESECTION     WRIST FRACTURE SURGERY      Family History  Problem Relation Age of Onset   Hypertension Other     Social History   Socioeconomic History   Marital status: Married    Spouse name: Not on file   Number of children: Not on file   Years of education: Not on file   Highest education level: Not on file  Occupational History   Not on file  Tobacco Use   Smoking status: Never   Smokeless tobacco: Never  Vaping Use   Vaping Use: Never used  Substance and Sexual Activity   Alcohol use: No   Drug use: No   Sexual activity: Yes  Other Topics Concern   Not on file  Social History Narrative   Not on file   Social Determinants of Health   Financial Resource Strain: Not on file  Food Insecurity: Not on file  Transportation Needs: Not on file  Physical Activity: Not on file  Stress: Not on file  Social Connections: Not on file  Intimate Partner Violence: Not on file     PHYSICAL EXAM:  VS: There were no vitals taken for this visit. Physical Exam Gen: NAD, alert, cooperative with exam, well-appearing MSK:  ***      ASSESSMENT & PLAN:   No problem-specific Assessment & Plan notes found for this encounter.

## 2021-01-19 ENCOUNTER — Ambulatory Visit (INDEPENDENT_AMBULATORY_CARE_PROVIDER_SITE_OTHER): Payer: BLUE CROSS/BLUE SHIELD | Admitting: Family Medicine

## 2021-01-19 ENCOUNTER — Other Ambulatory Visit: Payer: Self-pay

## 2021-01-19 ENCOUNTER — Encounter: Payer: Self-pay | Admitting: Family Medicine

## 2021-01-19 VITALS — BP 128/76 | Ht 77.0 in | Wt >= 6400 oz

## 2021-01-19 DIAGNOSIS — G5602 Carpal tunnel syndrome, left upper limb: Secondary | ICD-10-CM | POA: Diagnosis not present

## 2021-01-19 DIAGNOSIS — M7711 Lateral epicondylitis, right elbow: Secondary | ICD-10-CM

## 2021-01-19 MED ORDER — NITROGLYCERIN 0.2 MG/HR TD PT24
MEDICATED_PATCH | TRANSDERMAL | 11 refills | Status: AC
Start: 2021-01-19 — End: ?

## 2021-01-19 NOTE — Assessment & Plan Note (Signed)
Symptoms most consistent with carpal tunnel.   -Counseled on home exercise therapy and supportive care. -Brace. -Could consider physical therapy or injection.

## 2021-01-19 NOTE — Progress Notes (Signed)
  [name] - 45 y.o. male MRN [id number]  Date of birth: 1975/07/24  SUBJECTIVE:  Including CC & ROS.  No chief complaint on file.   [name] is a 45 y.o. male that is presenting symptoms of his lengthening Using the left hand.  Seems to be exacerbated with using his hand.  Also having some right lateral elbow pain.   Review of Systems See HPI   HISTORY: Past Medical, Surgical, Social, and Family History Reviewed & Updated per EMR.   Pertinent Historical Findings include:  Past Medical History:  Diagnosis Date   Eczema    Goals of care, counseling/discussion 12/18/2020   MGUS (monoclonal gammopathy of unknown significance) 12/18/2020   Pulmonary emboli [facility])     Past Surgical History:  Procedure Laterality Date   IR EPIDUROGRAPHY  12/11/2018   KNEE SURGERY     right   LAPAROSCOPIC GASTRIC BAND REMOVAL WITH LAPAROSCOPIC GASTRIC SLEEVE RESECTION     WRIST FRACTURE SURGERY      Family History  Problem Relation Age of Onset   Hypertension Other     Social History   Socioeconomic History   Marital status: Married    Spouse name: Not on file   Number of children: Not on file   Years of education: Not on file   Highest education level: Not on file  Occupational History   Not on file  Tobacco Use   Smoking status: Never   Smokeless tobacco: Never  Vaping Use   Vaping Use: Never used  Substance and Sexual Activity   Alcohol use: No   Drug use: No   Sexual activity: Yes  Other Topics Concern   Not on file  Social History Narrative   Not on file   Social Determinants of Health   Financial Resource Strain: Not on file  Food Insecurity: Not on file  Transportation Needs: Not on file  Physical Activity: Not on file  Stress: Not on file  Social Connections: Not on file  Intimate Partner Violence: Not on file     PHYSICAL EXAM:  VS: BP 128/76 (BP Location: Right Arm, Patient Position: Sitting, Cuff Size: Large)   Ht '6\' 5"'$  (1.956 m)   Wt (!) 452 lb  (205 kg)   BMI 53.60 kg/m  Physical Exam Gen: NAD, alert, cooperative with exam, well-appearing MSK:  Left wrist:  No signs of atrophy  Normal grip strength. Positive Tinel's of the wrist. Neurovascularly intact     ASSESSMENT & PLAN:   Carpal tunnel syndrome of left wrist Symptoms most consistent with carpal tunnel.   -Counseled on home exercise therapy and supportive care. -Brace. -Could consider physical therapy or injection.  Lateral epicondylitis, right elbow No injury or inciting event.  Most consistent with epicondylitis. -Counseled on home exercise therapy and supportive care. -Nitro patches. -Could consider injection, shockwave or physical therapy.

## 2021-01-19 NOTE — Patient Instructions (Signed)
Good to see you Please wear the brace at night or activities that make the finger symptoms worse  Please try the exercises  Please try the nitro. Let me know if you have headaches   Please send me a message in MyChart with any questions or updates.  Please see me back in 4 weeks.   --Dr. Raeford Razor  Nitroglycerin Protocol  Apply 1/4 nitroglycerin patch to affected area daily. Change position of patch within the affected area every 24 hours. You may experience a headache during the first 1-2 weeks of using the patch, these should subside. If you experience headaches after beginning nitroglycerin patch treatment, you may take your preferred over the counter pain reliever. Another side effect of the nitroglycerin patch is skin irritation or rash related to patch adhesive. Please notify our office if you develop more severe headaches or rash, and stop the patch. Tendon healing with nitroglycerin patch may require 12 to 24 weeks depending on the extent of injury. Men should not use if taking Viagra, Cialis, or Levitra.  Do not use if you have migraines or rosacea.

## 2021-01-19 NOTE — Assessment & Plan Note (Signed)
No injury or inciting event.  Most consistent with epicondylitis. -Counseled on home exercise therapy and supportive care. -Nitro patches. -Could consider injection, shockwave or physical therapy.

## 2021-01-22 ENCOUNTER — Ambulatory Visit: Payer: Commercial Managed Care - PPO | Admitting: Medical

## 2021-02-22 ENCOUNTER — Other Ambulatory Visit: Payer: Self-pay

## 2021-02-22 ENCOUNTER — Ambulatory Visit (INDEPENDENT_AMBULATORY_CARE_PROVIDER_SITE_OTHER): Payer: BLUE CROSS/BLUE SHIELD | Admitting: Family Medicine

## 2021-02-22 VITALS — Ht 77.0 in | Wt >= 6400 oz

## 2021-02-22 DIAGNOSIS — M7711 Lateral epicondylitis, right elbow: Secondary | ICD-10-CM

## 2021-02-22 DIAGNOSIS — R29898 Other symptoms and signs involving the musculoskeletal system: Secondary | ICD-10-CM

## 2021-02-22 DIAGNOSIS — G5602 Carpal tunnel syndrome, left upper limb: Secondary | ICD-10-CM

## 2021-02-22 NOTE — Progress Notes (Signed)
  [name] - 45 y.o. male MRN [id number]  Date of birth: 1975-11-15  SUBJECTIVE:  Including CC & ROS.  No chief complaint on file.   [name] is a 45 y.o. male that is following up for his right elbow and left wrist pain.  Still having pain in each of those areas.  He is also describing a weakness in his lower extremity.  He feels normal in his upper extremity.  He has a slight weakness that distributes in each leg and has an altered sensation in the right knee.  Has had back issues in the past.  Independent review of the MRI lumbar spine from 08/20/18 shows no spinal stenosis.   Review of Systems See HPI   HISTORY: Past Medical, Surgical, Social, and Family History Reviewed & Updated per EMR.   Pertinent Historical Findings include:  Past Medical History:  Diagnosis Date   Eczema    Goals of care, counseling/discussion 12/18/2020   MGUS (monoclonal gammopathy of unknown significance) 12/18/2020   Pulmonary emboli [facility])     Past Surgical History:  Procedure Laterality Date   IR EPIDUROGRAPHY  12/11/2018   KNEE SURGERY     right   LAPAROSCOPIC GASTRIC BAND REMOVAL WITH LAPAROSCOPIC GASTRIC SLEEVE RESECTION     WRIST FRACTURE SURGERY      Family History  Problem Relation Age of Onset   Hypertension Other     Social History   Socioeconomic History   Marital status: Married    Spouse name: Not on file   Number of children: Not on file   Years of education: Not on file   Highest education level: Not on file  Occupational History   Not on file  Tobacco Use   Smoking status: Never   Smokeless tobacco: Never  Vaping Use   Vaping Use: Never used  Substance and Sexual Activity   Alcohol use: No   Drug use: No   Sexual activity: Yes  Other Topics Concern   Not on file  Social History Narrative   Not on file   Social Determinants of Health   Financial Resource Strain: Not on file  Food Insecurity: Not on file  Transportation Needs: Not on file  Physical  Activity: Not on file  Stress: Not on file  Social Connections: Not on file  Intimate Partner Violence: Not on file     PHYSICAL EXAM:  VS: Ht 6\' 5"  (1.956 m)   Wt (!) 425 lb (192.8 kg)   BMI 50.40 kg/m  Physical Exam Gen: NAD, alert, cooperative with exam, well-appearing   ASSESSMENT & PLAN:   Weakness of both lower extremities Symptoms sound suggestive of claudication.  No history of spinal stenosis.  Does have a history of previous back surgery. -Counseled on home exercise therapy and supportive care. -Pursue ABIs. -May need to consider nerve study.  Lateral epicondylitis, right elbow Continues to have pain at the elbow. -Counseled on home exercise therapy and supportive care. -Could pursue injection or physical therapy.  Carpal tunnel syndrome of left wrist Having similar symptoms on the right hand as he does on the left.  Bracing does not seem to help at night.  Wants to defer injection at this time. -Counseled on home exercise therapy and supportive care. -Could consider nerve study if both extremities are affected. -Could consider physical therapy.

## 2021-02-22 NOTE — Assessment & Plan Note (Signed)
Having similar symptoms on the right hand as he does on the left.  Bracing does not seem to help at night.  Wants to defer injection at this time. -Counseled on home exercise therapy and supportive care. -Could consider nerve study if both extremities are affected. -Could consider physical therapy.

## 2021-02-22 NOTE — Assessment & Plan Note (Signed)
Continues to have pain at the elbow. -Counseled on home exercise therapy and supportive care. -Could pursue injection or physical therapy.

## 2021-02-22 NOTE — Patient Instructions (Signed)
Good to see you Your MRI doesn't show any spinal stenosis  We'll check the blood flow in your legs. I will call with these results.   Please send me a message in MyChart with any questions or updates.  Please see me back in 4 weeks.   --Dr. Raeford Razor

## 2021-02-22 NOTE — Assessment & Plan Note (Signed)
Symptoms sound suggestive of claudication.  No history of spinal stenosis.  Does have a history of previous back surgery. -Counseled on home exercise therapy and supportive care. -Pursue ABIs. -May need to consider nerve study.

## 2021-02-24 ENCOUNTER — Ambulatory Visit (HOSPITAL_COMMUNITY)
Admission: RE | Admit: 2021-02-24 | Discharge: 2021-02-24 | Disposition: A | Discharge status: Home / Self Care | Payer: BLUE CROSS/BLUE SHIELD | Source: Ambulatory Visit | Attending: Family Medicine | Admitting: Family Medicine

## 2021-02-24 ENCOUNTER — Other Ambulatory Visit: Payer: Self-pay

## 2021-02-24 ENCOUNTER — Ambulatory Visit (HOSPITAL_COMMUNITY): Payer: BLUE CROSS/BLUE SHIELD

## 2021-02-24 DIAGNOSIS — R29898 Other symptoms and signs involving the musculoskeletal system: Secondary | ICD-10-CM | POA: Insufficient documentation

## 2021-02-26 ENCOUNTER — Telehealth: Payer: Self-pay | Admitting: Family Medicine

## 2021-02-26 DIAGNOSIS — R29898 Other symptoms and signs involving the musculoskeletal system: Secondary | ICD-10-CM

## 2021-02-26 NOTE — Telephone Encounter (Signed)
Informed of results and we will pursue a nerve study in his bilateral lower extremities.  Rosemarie Ax, MD [facility] 02/26/2021, 9:03 AM

## 2021-03-22 ENCOUNTER — Ambulatory Visit (INDEPENDENT_AMBULATORY_CARE_PROVIDER_SITE_OTHER): Payer: BLUE CROSS/BLUE SHIELD | Admitting: Family Medicine

## 2021-03-22 VITALS — BP 137/93 | Ht 77.0 in | Wt >= 6400 oz

## 2021-03-22 DIAGNOSIS — R531 Weakness: Secondary | ICD-10-CM | POA: Diagnosis not present

## 2021-03-22 DIAGNOSIS — Z6841 Body Mass Index (BMI) 40.0 and over, adult: Secondary | ICD-10-CM

## 2021-03-22 DIAGNOSIS — R29898 Other symptoms and signs involving the musculoskeletal system: Secondary | ICD-10-CM

## 2021-03-22 DIAGNOSIS — E66813 Obesity, class 3: Secondary | ICD-10-CM

## 2021-03-22 NOTE — Progress Notes (Signed)
  [name] - 45 y.o. male MRN [id number]  Date of birth: September 20, 1975  SUBJECTIVE:  Including CC & ROS.  No chief complaint on file.   [name] is a 45 y.o. male that is following up for his lower extremity weakness.  His most recent ABIs were normal.  He has a neurology consult pending for consideration of nerve studies.  He also has excessive pannus that is likely leading to his acute on chronic low back pain.   Review of Systems See HPI   HISTORY: Past Medical, Surgical, Social, and Family History Reviewed & Updated per EMR.   Pertinent Historical Findings include:  Past Medical History:  Diagnosis Date  . Eczema   . Goals of care, counseling/discussion 12/18/2020  . MGUS (monoclonal gammopathy of unknown significance) 12/18/2020  . Pulmonary emboli [facility])     Past Surgical History:  Procedure Laterality Date  . IR EPIDUROGRAPHY  12/11/2018  . KNEE SURGERY     right  . LAPAROSCOPIC GASTRIC BAND REMOVAL WITH LAPAROSCOPIC GASTRIC SLEEVE RESECTION    . WRIST FRACTURE SURGERY      Family History  Problem Relation Age of Onset  . Hypertension Other     Social History   Socioeconomic History  . Marital status: Married    Spouse name: Not on file  . Number of children: Not on file  . Years of education: Not on file  . Highest education level: Not on file  Occupational History  . Not on file  Tobacco Use  . Smoking status: Never  . Smokeless tobacco: Never  Vaping Use  . Vaping Use: Never used  Substance and Sexual Activity  . Alcohol use: No  . Drug use: No  . Sexual activity: Yes  Other Topics Concern  . Not on file  Social History Narrative  . Not on file   Social Determinants of Health   Financial Resource Strain: Not on file  Food Insecurity: Not on file  Transportation Needs: Not on file  Physical Activity: Not on file  Stress: Not on file  Social Connections: Not on file  Intimate Partner Violence: Not on file     PHYSICAL EXAM:  VS: BP  (!) 137/93   Ht 6\' 5"  (1.956 m)   Wt (!) 410 lb (186 kg)   BMI 48.62 kg/m  Physical Exam Gen: NAD, alert, cooperative with exam, well-appearing   ASSESSMENT & PLAN:   Obesity, Class III, BMI 40-49.9 (morbid obesity) ([place]) Is excessive abdominal skin is likely contributing to his acute exacerbation of his low back pains. -Counseled on supportive care. -Referral to plastic surgery.  Weakness of both lower extremities Work-up thus far has been unrevealing. -Counseled on supportive care. -Has pending neurology consult for consideration of nerve study.  We will follow-up after that.

## 2021-03-22 NOTE — Patient Instructions (Signed)
Good to see you We'll send the referral to plastic surgery   Please send me a message in [place] with any questions or updates.  Please see me back after the neurology consult.   --Dr. Raeford Razor'

## 2021-03-22 NOTE — Assessment & Plan Note (Signed)
Work-up thus far has been unrevealing. -Counseled on supportive care. -Has pending neurology consult for consideration of nerve study.  We will follow-up after that.

## 2021-03-22 NOTE — Assessment & Plan Note (Signed)
Is excessive abdominal skin is likely contributing to his acute exacerbation of his low back pains. -Counseled on supportive care. -Referral to plastic surgery.

## 2021-03-26 ENCOUNTER — Encounter: Payer: Self-pay | Admitting: Neurology

## 2021-03-26 ENCOUNTER — Ambulatory Visit (INDEPENDENT_AMBULATORY_CARE_PROVIDER_SITE_OTHER): Payer: BLUE CROSS/BLUE SHIELD | Admitting: Neurology

## 2021-03-26 VITALS — BP 139/90 | HR 68 | Ht 77.0 in | Wt >= 6400 oz

## 2021-03-26 DIAGNOSIS — R200 Anesthesia of skin: Secondary | ICD-10-CM | POA: Diagnosis not present

## 2021-03-26 DIAGNOSIS — R29898 Other symptoms and signs involving the musculoskeletal system: Secondary | ICD-10-CM | POA: Diagnosis not present

## 2021-03-26 DIAGNOSIS — R202 Paresthesia of skin: Secondary | ICD-10-CM

## 2021-03-26 NOTE — Progress Notes (Signed)
Subjective:    Patient ID: [name] is a 45 y.o. male.  HPI    Interim history:   Dear Dr. Raeford Razor,   I saw your patient, [name] requesting a neurologic clinic today for initial consultation of his lower extremity weakness.  The patient is unaccompanied today.  As you know, [name] is a 45 year old right-handed gentleman with an underlying history of MGUS, pulmonary embolus, eczema, and morbid obesity with a BMI of over 55, who reports intermittent weakness in both lower extremities, feels like his legs are heavy.  Also has had numbness intermittently affecting both distal lower extremities, some 3 to 4 weeks ago only felt numbness in the right distal leg and sometimes he wakes up with pain in the left foot.  He has not fallen recently.  He does not drag 1 foot.  He has woken up with his hands locked up particularly the right hand and also numbness in both hands, particularly the left hand.  He is suspected to have carpal tunnel. I evaluated the patient 2 years ago for numbness and tingling of both upper and lower extremities, he reported back pain and neck pain at the time as well.  I suggested we proceed with blood work including vitamin D, A1c, B6, ANA, CK, TSH, ESR, RPR, CMP, B12, folate, B1, multiple myeloma panel.  His vitamin D was below normal.  His ESR was mildly elevated.  A1c was 5.9.  CK level was normal, TSH normal, RPR nonreactive, B12 level on the lower end at 309.  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  We proceeded with a brain MRI on 03/26/19, and I reviewed the results: IMPRESSION:    MRI brain (with and without) demonstrating: - Few scattered foci of nonspecific gliosis in the subcortical and juxtacortical white matter. - No acute findings.  No abnormal enhancing lesions.  He was notified by phone call.   I referred him to hematology.  He had a recent appointment with Dr. Marin Olp on 12/18/2020, and I reviewed the note.  Observation  was recommended and a follow-up was scheduled for 6 months. I reviewed your office note from 02/22/2021.  A cervical spine MRI from 02/28/2019 showed mild cervical spondylosis without central canal or foraminal narrowing.  He also had a lumbar spine MRI on 08/20/2018 which showed no disc protrusion or spinal stenosis, but asymmetric edema, arthropathy and periarticular inflammatory change involving the right L4-5 facet.  I also suggested we proceed with an EMG nerve conduction velocity test but he did not pursue this.    He tries to hydrate well with water.  He does not drink alcohol, he is a non-smoker and drinks caffeine in the form of tea, 1 serving per day on average.  His weight has been stable.  Previously:  03/07/19: 45 year old right-handed gentleman with an underlying medical history of pulmonary embolus, eczema, morbid obesity with a BMI of over 50 with status post sleeve gastrectomy on 03/06/2017 through [facility], who reports a longer standing history of low back pain.  He has had neck pain.  I reviewed your office records, most recent office note from 03/04/2019.  He had a recent cervical spine MRI without contrast on 02/28/2019 and I reviewed the results: IMPRESSION: No finding to explain the patient's symptoms. Mild cervical spondylosis without central canal or foraminal narrowing.   He had a lumbar spine MRI without contrast on 08/20/2018 and I reviewed the results: IMPRESSION: No disc protrusion or spinal stenosis.  With regard to the clinical question, there is no compressive LEFT-sided abnormality.   Asymmetric edema, arthropathy, and periarticular inflammatory change involving the RIGHT L4-5 facet. This is of uncertain clinical significance given LEFT-sided pain.   Increased body habitus without features of epidural lipomatosis.   Suspect solid arthrodesis across the RIGHT L5-S1 facet.   Prior to that he reportedly had a lumbar spine MRI and [place] on 01/11/2016.   He  is status post lumbar spine facet injections under Dr. Mina Marble with some relief of his back pain.  He reports numbness and tingling in his upper extremities.  He has had these symptoms for years but they have worsened over the past 6 months. He was tested for carpal tunnel syndrome many years ago.  He had EMG nerve conduction testing for this as I understand.  He had a sleep study before his weight loss surgery and was told he did not need CPAP therapy.  Sleep study testing was in 2018 by patient's report.  He reports that he talk to you about his symptoms and that he was told he could have symptoms from [place].  He is worried about having MS. he has no family history of MS. he has had some facial tingling on the left side at times, his numbness and tingling are intermittent and transient, lasting anywhere from 15 to 30 seconds.  About 2 weeks ago he felt a sudden jolt of back pain and it radiated to the left leg which is not uncommon for him.  He felt like he would fall.  He went to the bathroom and stood up and felt like the left leg would give out and fell back onto the toilet commode, did not fall to the ground.  Did not hurt himself.  His symptoms improved.  He did not seek immediate medical attention or call 911.  He lives with his wife.  He denies any history of loss of vision.  Last year he had some blurry vision but changed from contacts to his glasses and blurry vision intermittently caused him to blink more and his blurry vision improved.  He denies any double vision.  He had an eye examination last year.  He has not had a physical this year, he had blood work in September 2019.  He had a checkup with his weight management doctor in July and was told to come back in 2 years.  Altogether, he reports that he lost about 120 pounds since his weight loss surgery 2 years ago today.  His Past Medical History Is Significant For: Past Medical History:  Diagnosis Date   Eczema    Goals of care,  counseling/discussion 12/18/2020   MGUS (monoclonal gammopathy of unknown significance) 12/18/2020   Pulmonary emboli (HCC)     His Past Surgical History Is Significant For: Past Surgical History:  Procedure Laterality Date   IR EPIDUROGRAPHY  12/11/2018   KNEE SURGERY     right   LAPAROSCOPIC GASTRIC BAND REMOVAL WITH LAPAROSCOPIC GASTRIC SLEEVE RESECTION     WRIST FRACTURE SURGERY      His Family History Is Significant For: Family History  Problem Relation Age of Onset   Hypertension Other     His Social History Is Significant For: Social History   Socioeconomic History   Marital status: Married    Spouse name: Jerral Bonito   Number of children: Not on file   Years of education: Not on file   Highest education level: Not on file  Occupational  History   Not on file  Tobacco Use   Smoking status: Never   Smokeless tobacco: Never  Vaping Use   Vaping Use: Never used  Substance and Sexual Activity   Alcohol use: No   Drug use: No   Sexual activity: Yes  Other Topics Concern   Not on file  Social History Narrative   Lives with wife, kids   Social Determinants of Health   Financial Resource Strain: Not on file  Food Insecurity: Not on file  Transportation Needs: Not on file  Physical Activity: Not on file  Stress: Not on file  Social Connections: Not on file    His Allergies Are:  No Known Allergies:   His Current Medications Are:  Outpatient Encounter Medications as of 03/26/2021  Medication Sig   acetaminophen (TYLENOL) 500 MG tablet Take 500 mg by mouth every 6 (six) hours as needed.   Cholecalciferol (VITAMIN D3) 1.25 MG (50000 UT) CAPS Take 1 capsule by mouth once a week.   fluticasone (FLONASE) 50 MCG/ACT nasal spray Place 2 sprays into both nostrils daily.   Multiple Vitamins-Minerals (BARIATRIC MULTIVITAMINS/IRON PO) Take 1 tablet by mouth daily.   nitroGLYCERIN (NITRODUR - DOSED IN MG/24 HR) 0.2 mg/hr patch Cut and apply 1/4 patch to most painful area  q24h.   sildenafil (VIAGRA) 50 MG tablet Take 50 mg by mouth as needed.   triamcinolone cream (KENALOG) 0.1 % Apply topically.   No facility-administered encounter medications on file as of 03/26/2021.  :  Review of Systems:  Out of a complete 14 point review of systems, all are reviewed and negative with the exception of these symptoms as listed below:   Review of Systems  Neurological:        Established patient here for bilateral lower leg weakness.   Objective:  Neurological Exam  Physical Exam Physical Examination:   Vitals:   03/26/21 0919  BP: 139/90  Pulse: 68    General Examination: The patient is a very pleasant 45 y.o. male in no acute distress. He appears well-developed and well-nourished and well groomed.   HEENT: Normocephalic, atraumatic, pupils are equal, round and reactive to light, corrective eyeglasses in place.  Tracking well-preserved, hearing intact with tuning fork.  Face is symmetric with normal facial animation and normal facial sensation to light touch, temperature, vibration and pinprick. Speech is clear with no dysarthria noted. There is no hypophonia. There is no lip, neck/head, jaw or voice tremor. Neck is supple with full range of passive and active motion. There are no carotid bruits on auscultation. Oropharynx exam reveals: mild mouth dryness, adequate dental hygiene and moderate airway crowding.  Tongue protrudes centrally in palate elevates symmetrically.   Chest: Clear to auscultation without wheezing, rhonchi or crackles noted.   Heart: S1+S2+0, regular and normal without murmurs, rubs or gallops noted.    Abdomen: Soft, non-tender and non-distended.   Extremities: There is no pitting edema in the distal lower extremities bilaterally.    Skin: Warm and dry without trophic changes noted.   Musculoskeletal: exam reveals no obvious joint deformities, tenderness or joint swelling or erythema.    Neurologically:  Mental status: The patient is  awake, alert and oriented in all 4 spheres. His immediate and remote memory, attention, language skills and fund of knowledge are appropriate. There is no evidence of aphasia, agnosia, apraxia or anomia. Speech is clear with normal prosody and enunciation. Thought process is linear. Mood is normal and affect is normal.  Cranial nerves  II - XII are as described above under HEENT exam. In addition: shoulder shrug is normal with equal shoulder height noted. Motor exam: Normal bulk, strength and tone is noted. There is no drift, tremor or rebound. Romberg is negative. Reflexes are 1+ in the upper extremities and difficult to elicit in the lower extremities, perhaps trace in the knees and ankles.  Toes are downgoing bilaterally. Fine motor skills and coordination: intact with normal finger taps, normal hand movements, normal rapid alternating patting, normal foot taps and normal foot agility.  Cerebellar testing: No dysmetria or intention tremor on finger to nose testing. Heel to shin is unremarkable bilaterally, no truncal or gait ataxia.  Sensory exam: intact to light touch, pinprick, vibration, temperature in the upper and lower extremities, with the exception of relatively decreased vibration sense in the left distal lower extremity up to the ankle or above the ankle area compared to the right.  Gait, station and balance: He stands easily. No veering to one side is noted. No leaning to one side is noted. Posture is age-appropriate and stance is narrow based. Gait shows normal stride length and normal pace. No problems turning are noted. Tandem walk is doable slowly.               Assessment and Plan:  In summary, [name] is a very pleasant 45 year old male with an underlying history of MGUS, pulmonary embolus, eczema, and morbid obesity with a BMI of over 55, with status post sleeve gastrectomy on 03/06/2017, who presents for evaluation of his lower extremity subjective weakness of several months  duration.  He has had paresthesias and particularly lower extremity numbness and tingling in the past several months.  Work-up in 2020 was extensive but we did not end up doing an EMG nerve conduction velocity test.  He is agreeable to proceeding with neurophysiological testing.  He was found to have a monoclonal gammopathy and recently established with hematology.  He has no obvious focal or generalized weakness.  We did a brain MRI in 2020 and he also had prior neck and lumbar spine MRIs.  At this juncture, we will proceed with an EMG and nerve conduction velocity test and we will call him with the test results.  Unfortunately, there is not a whole lot I can offer at this time.  His history and examination does not suggest an underlying neuromuscular disease per se.  Nevertheless, we will keep him posted as to his test results by phone call.  I answered all his questions today and he was in agreement.   I spent 40 minutes in total face-to-face time and in reviewing records during pre-charting, more than 50% of which was spent in counseling and coordination of care, reviewing test results, reviewing medications and treatment regimen and/or in discussing or reviewing the diagnosis of paresthesias, weakness, the prognosis and treatment options. Pertinent laboratory and imaging test results that were available during this visit with the patient were reviewed by me and considered in my medical decision making (see chart for details).   Thank you very much for allowing me to participate in the care of this nice patient. If I can be of any further assistance to you please do not hesitate to call me at [phone].   Sincerely,     Star Age, MD, PhD

## 2021-03-26 NOTE — Patient Instructions (Addendum)
  It was nice to see you again today.  We did extensive work-up in 2020, I agree with Dr. Raeford Razor to proceed with an electrical test through this office.  We will do an EMG and nerve conduction velocity test, which is an electrical nerve and muscle test, which we will schedule. We will call you with the results. If you have significant carpal tunnel type changes in the hands, I will likely recommend evaluation through a hand surgeon.  We will await test.

## 2021-04-26 ENCOUNTER — Telehealth: Payer: Self-pay | Admitting: Diagnostic Neuroimaging

## 2021-04-26 NOTE — Telephone Encounter (Signed)
I lvm for patient to call us back to reschedule ncs/emg because the office is closing early 12/15. I held a spot for him this Thursday 12/1 at 3:30pm. Will also send mychart message.

## 2021-04-29 ENCOUNTER — Ambulatory Visit (INDEPENDENT_AMBULATORY_CARE_PROVIDER_SITE_OTHER): Payer: BLUE CROSS/BLUE SHIELD | Admitting: Family Medicine

## 2021-04-29 ENCOUNTER — Encounter: Payer: Self-pay | Admitting: Family Medicine

## 2021-04-29 VITALS — BP 148/82 | Ht 77.0 in | Wt >= 6400 oz

## 2021-04-29 DIAGNOSIS — S86812A Strain of other muscle(s) and tendon(s) at lower leg level, left leg, initial encounter: Secondary | ICD-10-CM | POA: Insufficient documentation

## 2021-04-29 DIAGNOSIS — M654 Radial styloid tenosynovitis [de Quervain]: Secondary | ICD-10-CM | POA: Insufficient documentation

## 2021-04-29 NOTE — Progress Notes (Signed)
  [name] - 45 y.o. male MRN [id number]  Date of birth: 09/12/1975  SUBJECTIVE:  Including CC & ROS.  No chief complaint on file.   [name] is a 45 y.o. male that is presenting with right wrist pain.  Also reports to having some pain over the left knee.  The wrist pain is been present for about 1 month.  It is worse in the morning and with certain movements.  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   Review of Systems See HPI   HISTORY: Past Medical, Surgical, Social, and Family History Reviewed & Updated per EMR.   Pertinent Historical Findings include:  Past Medical History:  Diagnosis Date   Eczema    Goals of care, counseling/discussion 12/18/2020   MGUS (monoclonal gammopathy of unknown significance) 12/18/2020   Pulmonary emboli [facility])     Past Surgical History:  Procedure Laterality Date   IR EPIDUROGRAPHY  12/11/2018   KNEE SURGERY     right   LAPAROSCOPIC GASTRIC BAND REMOVAL WITH LAPAROSCOPIC GASTRIC SLEEVE RESECTION     WRIST FRACTURE SURGERY      Family History  Problem Relation Age of Onset   Hypertension Other     Social History   Socioeconomic History   Marital status: Married    Spouse name: Jerral Bonito   Number of children: Not on file   Years of education: Not on file   Highest education level: Not on file  Occupational History   Not on file  Tobacco Use   Smoking status: Never   Smokeless tobacco: Never  Vaping Use   Vaping Use: Never used  Substance and Sexual Activity   Alcohol use: No   Drug use: No   Sexual activity: Yes  Other Topics Concern   Not on file  Social History Narrative   Lives with wife, kids   Social Determinants of Health   Financial Resource Strain: Not on file  Food Insecurity: Not on file  Transportation Needs: Not on file  Physical Activity: Not on file  Stress: Not on file  Social Connections: Not on file  Intimate Partner Violence: Not on file     PHYSICAL EXAM:  VS: BP  (!) 148/82 (BP Location: Right Arm, Patient Position: Sitting)   Ht 6\' 5"  (1.956 m)   Wt (!) 452 lb (205 kg)   BMI 53.60 kg/m  Physical Exam Gen: NAD, alert, cooperative with exam, well-appearing    ASSESSMENT & PLAN:   De Quervain's tenosynovitis, right Has positive Finkelstein test on exam most consistent with de Quervain's.   -Counseled on home exercise therapy and supportive care. -Thumb spica brace today. -Could consider injection physical therapy.  Patellar tendon strain, left, initial encounter Symptoms most consistent with patellar strain with pain at the inferior pole the patella. -Counseled on home exercise therapy and supportive care. -Could consider shockwave therapy or physical therapy.

## 2021-04-29 NOTE — Assessment & Plan Note (Signed)
Has positive Finkelstein test on exam most consistent with de Quervain's.   -Counseled on home exercise therapy and supportive care. -Thumb spica brace today. -Could consider injection physical therapy.

## 2021-04-29 NOTE — Assessment & Plan Note (Signed)
Symptoms most consistent with patellar strain with pain at the inferior pole the patella. -Counseled on home exercise therapy and supportive care. -Could consider shockwave therapy or physical therapy.

## 2021-04-29 NOTE — Patient Instructions (Signed)
Good to see you Please try the exercises  Please try the brace  Please use ice as needed   Please send me a message in MyChart with any questions or updates.  Please see me back in 4 weeks.   --Dr. Raeford Razor

## 2021-04-30 ENCOUNTER — Ambulatory Visit (INDEPENDENT_AMBULATORY_CARE_PROVIDER_SITE_OTHER): Payer: BLUE CROSS/BLUE SHIELD | Admitting: Plastic Surgery

## 2021-04-30 ENCOUNTER — Encounter: Payer: Self-pay | Admitting: Plastic Surgery

## 2021-04-30 ENCOUNTER — Other Ambulatory Visit: Payer: Self-pay

## 2021-04-30 VITALS — BP 122/74 | HR 69 | Ht 77.0 in | Wt >= 6400 oz

## 2021-04-30 DIAGNOSIS — M793 Panniculitis, unspecified: Secondary | ICD-10-CM | POA: Diagnosis not present

## 2021-04-30 NOTE — Progress Notes (Signed)
   Referring Provider Rosemarie Ax, MD [name],  [place] [id number]   CC:  Abdominal pannus   [name] is an 45 y.o. male.  HPI: Patient is a 45 year old with a history of gastric sleeve 4 years ago.  He is lost a significant amount of weight.  His max weight was 600 pounds and he is now 462 pounds.  His weight loss has leveled off.  He has back pain as well as rashes under his abdomen.  He is open to the idea of losing more weight.  He denies any diabetes.  He is a non-smoker.  No Known Allergies  Outpatient Encounter Medications as of 04/30/2021  Medication Sig   acetaminophen (TYLENOL) 500 MG tablet Take 500 mg by mouth every 6 (six) hours as needed.   Cholecalciferol (VITAMIN D3) 1.25 MG (50000 UT) CAPS Take 1 capsule by mouth once a week.   fluticasone (FLONASE) 50 MCG/ACT nasal spray Place 2 sprays into both nostrils daily.   Multiple Vitamins-Minerals (BARIATRIC MULTIVITAMINS/IRON PO) Take 1 tablet by mouth daily.   nitroGLYCERIN (NITRODUR - DOSED IN MG/24 HR) 0.2 mg/hr patch Cut and apply 1/4 patch to most painful area q24h.   sildenafil (VIAGRA) 50 MG tablet Take 50 mg by mouth as needed.   triamcinolone cream (KENALOG) 0.1 % Apply topically.   No facility-administered encounter medications on file as of 04/30/2021.     Past Medical History:  Diagnosis Date   Eczema    Goals of care, counseling/discussion 12/18/2020   MGUS (monoclonal gammopathy of unknown significance) 12/18/2020   Pulmonary emboli [facility])     Past Surgical History:  Procedure Laterality Date   IR EPIDUROGRAPHY  12/11/2018   KNEE SURGERY     right   LAPAROSCOPIC GASTRIC BAND REMOVAL WITH LAPAROSCOPIC GASTRIC SLEEVE RESECTION     WRIST FRACTURE SURGERY      Family History  Problem Relation Age of Onset   Hypertension Other     Social History   Social History Narrative   Lives with wife, kids     Review of Systems General: Denies fevers, chills, weight  loss CV: Denies chest pain, shortness of breath, palpitations   Physical Exam Vitals with BMI 04/30/2021 04/29/2021 03/26/2021  Height 6\' 5"  6\' 5"  6\' 5"   Weight 462 lbs 13 oz 452 lbs 467 lbs  BMI 54.87 22.02 54.27  Systolic 062 376 283  Diastolic 74 82 90  Pulse 69 - 68    General:  No acute distress,  Alert and oriented, Non-Toxic, Normal speech and affect Abdomen: Significant excess skin abdomen.  Also significant adipose.  Patient has skin hanging below the level of the pubis.  He has more skin laterally than centrally.  Assessment/Plan I think the patient is a potential candidate for panniculectomy but would like for him to lose more weight.  I am concerned that he would be prone to wound complications at his current weight.  I like to reevaluate him below a BMI of 50.  He is open to the [facility] health nutrition program.  Time based coding: 32 minutes were spent with the patient.  Greater than 50% was spent on counseling cordination of care.  We discussed the details of how we perform panniculectomy.  We discussed weight loss.  Lennice Sites 04/30/2021, 10:37 AM

## 2021-05-05 ENCOUNTER — Institutional Professional Consult (permissible substitution): Payer: BLUE CROSS/BLUE SHIELD | Admitting: Neurology

## 2021-05-11 ENCOUNTER — Institutional Professional Consult (permissible substitution): Payer: BLUE CROSS/BLUE SHIELD | Admitting: Neurology

## 2021-05-13 ENCOUNTER — Encounter: Payer: BLUE CROSS/BLUE SHIELD | Admitting: Diagnostic Neuroimaging

## 2021-05-27 ENCOUNTER — Ambulatory Visit: Payer: Self-pay

## 2021-05-27 ENCOUNTER — Ambulatory Visit (INDEPENDENT_AMBULATORY_CARE_PROVIDER_SITE_OTHER): Payer: BLUE CROSS/BLUE SHIELD | Admitting: Family Medicine

## 2021-05-27 ENCOUNTER — Encounter: Payer: Self-pay | Admitting: Family Medicine

## 2021-05-27 VITALS — BP 140/62 | Ht 77.0 in | Wt >= 6400 oz

## 2021-05-27 DIAGNOSIS — M654 Radial styloid tenosynovitis [de Quervain]: Secondary | ICD-10-CM

## 2021-05-27 MED ORDER — TRIAMCINOLONE ACETONIDE 40 MG/ML IJ SUSP
40.0000 mg | Freq: Once | INTRAMUSCULAR | Status: AC
Start: 2021-05-27 — End: 2021-05-27
  Administered 2021-05-27: 11:00:00 40 mg via INTRA_ARTICULAR

## 2021-05-27 NOTE — Patient Instructions (Signed)
Good to see you Please use ice as needed  Please continue the exercises   Please send me a message in MyChart with any questions or updates.  Please see me back in 4-6 weeks.   --Dr. Holbert Caples  

## 2021-05-27 NOTE — Addendum Note (Signed)
Addended by: Cresenciano Lick on: 05/27/2021 11:08 AM   Modules accepted: Orders

## 2021-05-27 NOTE — Assessment & Plan Note (Signed)
Acutely ongoing.  No improvement with bracing and medication. -Counseled on home exercise therapy and supportive care. -Injection. -Could consider shockwave or physical therapy.

## 2021-05-27 NOTE — Progress Notes (Signed)
[name] - 45 y.o. male MRN [id number]  Date of birth: Apr 15, 1976  SUBJECTIVE:  Including CC & ROS.  No chief complaint on file.   [name] is a 45 y.o. male that is presenting with acute worsening of his right wrist pain.  He has tried medications and splints with limited improvement.   Review of Systems See HPI   HISTORY: Past Medical, Surgical, Social, and Family History Reviewed & Updated per EMR.   Pertinent Historical Findings include:  Past Medical History:  Diagnosis Date   Eczema    Goals of care, counseling/discussion 12/18/2020   MGUS (monoclonal gammopathy of unknown significance) 12/18/2020   Pulmonary emboli [facility])     Past Surgical History:  Procedure Laterality Date   IR EPIDUROGRAPHY  12/11/2018   KNEE SURGERY     right   LAPAROSCOPIC GASTRIC BAND REMOVAL WITH LAPAROSCOPIC GASTRIC SLEEVE RESECTION     WRIST FRACTURE SURGERY      Family History  Problem Relation Age of Onset   Hypertension Other     Social History   Socioeconomic History   Marital status: Married    Spouse name: Jerral Bonito   Number of children: Not on file   Years of education: Not on file   Highest education level: Not on file  Occupational History   Not on file  Tobacco Use   Smoking status: Never   Smokeless tobacco: Never  Vaping Use   Vaping Use: Never used  Substance and Sexual Activity   Alcohol use: No   Drug use: No   Sexual activity: Yes  Other Topics Concern   Not on file  Social History Narrative   Lives with wife, kids   Social Determinants of Health   Financial Resource Strain: Not on file  Food Insecurity: Not on file  Transportation Needs: Not on file  Physical Activity: Not on file  Stress: Not on file  Social Connections: Not on file  Intimate Partner Violence: Not on file     PHYSICAL EXAM:  VS: BP 140/62 (BP Location: Left Arm, Patient Position: Sitting)    Ht 6\' 5"  (1.956 m)    Wt (!) 462 lb (209.6 kg)    BMI 54.79 kg/m  Physical  Exam Gen: NAD, alert, cooperative with exam, well-appearing   Limited ultrasound: Right wrist:  Effusion appreciated first dorsal compartment. No changes of the wrist joints.  Summary: Findings consistent with de Quervain's tenosynovitis  Ultrasound and interpretation by Clearance Coots, MD   Aspiration/Injection Procedure Note Reynolds Kittel 1976/03/27  Procedure: Injection Indications: Right wrist pain  Procedure Details Consent: Risks of procedure as well as the alternatives and risks of each were explained to the (patient/caregiver).  Consent for procedure obtained. Time Out: Verified patient identification, verified procedure, site/side was marked, verified correct patient position, special equipment/implants available, medications/allergies/relevent history reviewed, required imaging and test results available.  Performed.  The area was cleaned with iodine and alcohol swabs.    The right first dorsal compartment was injected using 1 cc's of 40 mg Depo-Medrol and 1 cc's of 0.25% bupivacaine with a 25 1 1/2" needle.  Ultrasound was used. Images were obtained in long views showing the injection.     A sterile dressing was applied.  Patient did tolerate procedure well.     ASSESSMENT & PLAN:   De Quervain's tenosynovitis, right Acutely ongoing.  No improvement with bracing and medication. -Counseled on home exercise therapy and supportive care. -Injection. -Could consider shockwave or physical therapy.

## 2021-06-18 ENCOUNTER — Other Ambulatory Visit: Payer: Commercial Managed Care - PPO

## 2021-06-18 ENCOUNTER — Ambulatory Visit: Payer: Commercial Managed Care - PPO | Admitting: Hematology & Oncology

## 2021-06-23 ENCOUNTER — Encounter: Payer: Commercial Managed Care - PPO | Admitting: Neurology

## 2021-06-23 ENCOUNTER — Ambulatory Visit (INDEPENDENT_AMBULATORY_CARE_PROVIDER_SITE_OTHER): Payer: Commercial Managed Care - PPO | Admitting: Neurology

## 2021-06-23 DIAGNOSIS — G5602 Carpal tunnel syndrome, left upper limb: Secondary | ICD-10-CM

## 2021-06-23 DIAGNOSIS — R29898 Other symptoms and signs involving the musculoskeletal system: Secondary | ICD-10-CM | POA: Diagnosis not present

## 2021-06-23 DIAGNOSIS — R202 Paresthesia of skin: Secondary | ICD-10-CM

## 2021-06-23 DIAGNOSIS — R200 Anesthesia of skin: Secondary | ICD-10-CM

## 2021-06-23 DIAGNOSIS — Z0289 Encounter for other administrative examinations: Secondary | ICD-10-CM

## 2021-06-23 NOTE — Progress Notes (Signed)
Full Name: [name] Gender: Male MRN #: [id number] Date of Birth: 1975-10-12    Visit Date: 06/23/2021 14:18 Age: 46 Years Examining Physician: Arlice Colt, MD  Referring Physician: Star Age, MD Height: 6 feet 5 inch Patient History: 31lbs   History:  [name] is a 46 year old man with bilateral hand numbness and bilateral leg numbness and weakness.  Nerve conduction studies:  NCV was difficult due to patient's size.  The right median motor response had a mildly delayed distal latency with normal amplitude and conduction velocity.  The left median and right ulnar motor responses had normal distal latencies, amplitudes and conduction velocities.  The right peroneal motor response had normal distal latency, amplitude and conduction velocity.  The reduced amplitude with stimulation at the fibular head was likely due to body habitus.  The right tibial motor response had a reduced amplitude but normal latency and conduction velocity.  The right tibial F-wave latency was mildly prolonged.  The right median sensory response had a delayed peak latency and reduced amplitude.  The right ulnar sensory response had a normal peak latency and slightly reduced amplitude.  The right radial sensory response was normal.  The left median sensory response was nonresponsive.  The left sural and superficial peroneal sensory responses were nonresponsive.  Electromyography: Needle EMG of selected muscles of the right arm, right leg and left hand was performed.    In the right arm, mild chronic denervation was noted in the triceps and some polyphasic motor units were noted in the extensor digitorum communis muscle the recruitment was normal.  Other muscles in the right arm were normal.  In the left hand, the abductor pollicis brevis muscle was normal.  In the right leg, there was mild chronic denervation noted in the peroneus longus, tibialis anterior gastrocnemius and abductor hallucis  muscles.    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 Felecia Shelling, MD, PhD, FAAN Certified in Neurology, Clinical Neurophysiology, Sleep Medicine, Pain Medicine and Neuroimaging Director, [place] [facility] [address], [place], [place] [address] [phone]   Verbal informed consent was obtained from the patient, patient was informed of potential risk of procedure, including bruising, bleeding, hematoma formation, infection, muscle weakness, muscle pain, numbness, among others.        [place]    Nerve / Sites Muscle Latency Ref. Amplitude Ref. Rel Amp Segments Distance Velocity Ref. Area    ms ms mV mV %  cm m/s m/s mVms  R Median - APB     Wrist APB 3.0 ?4.4 3.4 ?4.0 100 Wrist - APB 7   11.0     Upper arm APB 8.4  8.8  260 Upper arm - Wrist 27 50 ?49 30.2     Ulnar Wrist APB 2.2  1.5  16.8 Ulnar Wrist - APB    2.5  L Median - APB     Wrist APB 4.5 ?4.4 6.3 ?4.0 100 Wrist - APB 7   18.1     Upper arm APB 10.1  9.0  142 Upper arm - Wrist 28 49 ?49 35.0     Ulnar Wrist APB 2.6  3.4  38.3 Ulnar Wrist - APB    6.5  R  Ulnar - ADM     Wrist ADM 1.7 ?3.3 5.4 ?6.0 100 Wrist - ADM 7   14.9     B.Elbow ADM 6.6  7.9  146 B.Elbow - Wrist 24 49 ?49 28.0     A.Elbow ADM 8.7  7.7  98.2 A.Elbow - B.Elbow 10 49 ?49 26.7  R Peroneal - EDB     Ankle EDB 4.0 ?6.5 6.6 ?2.0 100 Ankle - EDB 9   18.4     Fib head EDB 11.3  1.4  21 Fib head - Ankle 33 45 ?44 5.2     Pop fossa EDB 13.5  5.0  359 Pop fossa - Fib head 10 44 ?44 17.7         Pop fossa - Ankle      R Tibial - AH     Ankle AH 3.6 ?5.8 3.3 ?4.0 100 Ankle - AH 9   10.8     Pop fossa AH 14.6  0.3  9.73 Pop fossa - Ankle 45 41 ?41 1.3                 SNC    Nerve / Sites Rec. Site Peak Lat Ref.  Amp Ref. Segments Distance    ms ms V V  cm  R Radial - Anatomical snuff box (Forearm)     Forearm Wrist 2.4 ?2.9 29 ?15 Forearm - Wrist 10  R Sural - Ankle (Calf)     Calf Ankle NR ?4.4 NR ?6 Calf - Ankle 14  R Superficial peroneal - Ankle     Lat leg Ankle NR ?4.4 NR ?6 Lat leg - Ankle 14  R Median - Orthodromic (Dig II, Mid palm)     Dig II Wrist 4.7 ?3.4 3 ?10 Dig II - Wrist 15  L Median - Orthodromic (Dig II, Mid palm)     Dig II Wrist NR ?3.4 NR ?10 Dig II - Wrist 15  R Ulnar - Orthodromic, (Dig V, Mid palm)     Dig V Wrist 2.9 ?3.1 3 ?5 Dig V - Wrist 104                 F  Wave    Nerve F Lat Ref.   ms ms  R Tibial - AH 59.3 ?56.0  R Ulnar - ADM 31.0 ?32.0         EMG Summary Table    Spontaneous MUAP Recruitment  Muscle IA Fib PSW Fasc Other Amp Dur. Poly Pattern  R. Deltoid Normal None None None _______ Normal Increased 1+ Reduced  R. Triceps brachii Normal None None None _______ Normal Normal Normal Normal  R. Biceps brachii Normal None None None _______ Normal Normal Normal Normal  R. Extensor digitorum communis Normal None None None _______ Normal Normal 1+ Normal  R. First dorsal interosseous Normal None None None _______ Normal Normal Normal Normal  R. Abductor pollicis brevis Normal None None None _______ Normal Normal Normal Normal  R. Vastus medialis Normal None None None _______ Normal Normal Normal Normal  R. Peroneus longus Normal None None None _______ Increased Increased 1+ Reduced  R. Tibialis anterior Normal None None None _______ Increased Increased 1+ Reduced  R. Gastrocnemius (Medial head) Normal None None None _______ Normal Increased 1+ Reduced  R. Abductor hallucis Normal None None None _______ Normal Normal Normal Normal  L. Abductor pollicis brevis Normal None None None _______ Normal Normal Normal Normal

## 2021-06-24 ENCOUNTER — Inpatient Hospital Stay: Payer: Commercial Managed Care - PPO | Admitting: Hematology & Oncology

## 2021-06-24 ENCOUNTER — Inpatient Hospital Stay: Payer: Commercial Managed Care - PPO | Attending: Hematology & Oncology

## 2021-06-24 ENCOUNTER — Encounter: Payer: Self-pay | Admitting: Hematology & Oncology

## 2021-06-24 ENCOUNTER — Other Ambulatory Visit: Payer: Self-pay

## 2021-06-24 VITALS — BP 127/76 | HR 66 | Temp 98.4°F | Resp 18 | Wt >= 6400 oz

## 2021-06-24 DIAGNOSIS — D472 Monoclonal gammopathy: Secondary | ICD-10-CM | POA: Diagnosis present

## 2021-06-24 LAB — CMP (CANCER CENTER ONLY)
ALT: 15 U/L (ref 0–44)
AST: 15 U/L (ref 15–41)
Albumin: 4 g/dL (ref 3.5–5.0)
Alkaline Phosphatase: 74 U/L (ref 38–126)
Anion gap: 8 (ref 5–15)
BUN: 13 mg/dL (ref 6–20)
CO2: 26 mmol/L (ref 22–32)
Calcium: 9.8 mg/dL (ref 8.9–10.3)
Chloride: 105 mmol/L (ref 98–111)
Creatinine: 0.85 mg/dL (ref 0.61–1.24)
GFR, Estimated: >60 mL/min (ref >60)
Glucose, Bld: 109 mg/dL — ABNORMAL HIGH (ref 70–99)
Potassium: 3.8 mmol/L (ref 3.5–5.1)
Sodium: 139 mmol/L (ref 135–145)
Total Bilirubin: 0.7 mg/dL (ref 0.3–1.2)
Total Protein: 7.5 g/dL (ref 6.5–8.1)

## 2021-06-24 LAB — CBC WITH DIFFERENTIAL (CANCER CENTER ONLY)
Abs Immature Granulocytes: 0.04 10*3/uL (ref 0.00–0.07)
Basophils Absolute: 0.1 10*3/uL (ref 0.0–0.1)
Basophils Relative: 1 %
Eosinophils Absolute: 0.2 10*3/uL (ref 0.0–0.5)
Eosinophils Relative: 3 %
HCT: 45 % (ref 39.0–52.0)
Hemoglobin: 14.7 g/dL (ref 13.0–17.0)
Immature Granulocytes: 1 %
Lymphocytes Relative: 29 %
Lymphs Abs: 2.2 10*3/uL (ref 0.7–4.0)
MCH: 29.2 pg (ref 26.0–34.0)
MCHC: 32.7 g/dL (ref 30.0–36.0)
MCV: 89.5 fL (ref 80.0–100.0)
Monocytes Absolute: 0.6 10*3/uL (ref 0.1–1.0)
Monocytes Relative: 9 %
Neutro Abs: 4.3 10*3/uL (ref 1.7–7.7)
Neutrophils Relative %: 57 %
Platelet Count: 277 10*3/uL (ref 150–400)
RBC: 5.03 MIL/uL (ref 4.22–5.81)
RDW: 13.1 % (ref 11.5–15.5)
WBC Count: 7.5 10*3/uL (ref 4.0–10.5)
nRBC: 0 % (ref 0.0–0.2)

## 2021-06-24 LAB — LACTATE DEHYDROGENASE: LDH: 153 U/L (ref 98–192)

## 2021-06-24 NOTE — Progress Notes (Signed)
Hematology and Oncology Follow Up Visit  [name] [id number] 27-Aug-1975 46 y.o. 06/24/2021   Principle Diagnosis:  IgG kappa MGUS   Current Therapy:        Observation   Interim History:  [name] is here today for follow-up.  We saw him 6 months ago.  He is still working for the [employer]  He is a bus Geophysicist/field seismologist.  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  I am so happy for him.  When we last saw him, his monoclonal spike was 0.5 g/dL.  The IgG level was 1400 mg/dL.  This is all holding pretty steady.  He has had no problems since we last saw him.  He has had no issues with COVID.  Is been no problems with infections overall.  He has had no rashes.  He has had no change in bowel or bladder habits.  He will be 46 years old this year.  He will have a colonoscopy done.  He has had no fever.  He has had no cough or shortness of breath.  He has had no headache.  Overall, his performance status is ECOG 0.     Medications:  Allergies as of 06/24/2021   No Known Allergies      Medication List        Accurate as of June 24, 2021 10:21 AM. If you have any questions, ask your nurse or doctor.          acetaminophen 500 MG tablet Commonly known as: TYLENOL Take 500 mg by mouth every 6 (six) hours as needed.   BARIATRIC MULTIVITAMINS/IRON PO Take 1 tablet by mouth daily.   fluticasone 50 MCG/ACT nasal spray Commonly known as: FLONASE Place 2 sprays into both nostrils daily.   nitroGLYCERIN 0.2 mg/hr patch Commonly known as: NITRODUR - Dosed in mg/24 hr Cut and apply 1/4 patch to most painful area q24h.   sildenafil 50 MG tablet Commonly known as: VIAGRA Take 50 mg by mouth as needed.   triamcinolone cream 0.1 % Commonly known as: KENALOG Apply topically.   Vitamin D3 1.25 MG (50000 UT) Caps Take 1 capsule by mouth once a week.        Allergies: No Known Allergies  Past Medical History, Surgical history, Social  history, and Family History were reviewed and updated.  Review of Systems: Review of Systems  Constitutional: Negative.   HENT: Negative.    Eyes: Negative.   Respiratory: Negative.    Cardiovascular: Negative.   Gastrointestinal: Negative.   Genitourinary: Negative.   Musculoskeletal: Negative.   Skin: Negative.   Neurological:  Positive for tingling.  Endo/Heme/Allergies: Negative.   Psychiatric/Behavioral: Negative.      Physical Exam:  weight is 461 lb 1.9 oz (209.2 kg) (abnormal). His oral temperature is 98.4 F (36.9 C). His blood pressure is 127/76 and his pulse is 66. His respiration is 18 and oxygen saturation is 98%.   Wt Readings from Last 3 Encounters:  06/24/21 (!) 461 lb 1.9 oz (209.2 kg)  05/27/21 (!) 462 lb (209.6 kg)  04/30/21 (!) 462 lb 12.8 oz (209.9 kg)    Ocular: Sclerae unicteric, pupils equal, round and reactive to light Ear-nose-throat: Oropharynx clear, dentition fair Lymphatic: No cervical or supraclavicular adenopathy Lungs no rales or rhonchi, good excursion bilaterally Heart regular rate and rhythm, no murmur appreciated Abd soft, nontender, positive bowel sounds, no liver or spleen tip palpated on exam, no fluid wave  MSK no focal spinal tenderness, no joint edema Neuro: non-focal, well-oriented, appropriate affect Breasts: Deferred   Lab Results  Component Value Date   WBC 7.5 06/24/2021   HGB 14.7 06/24/2021   HCT 45.0 06/24/2021   MCV 89.5 06/24/2021   PLT 277 06/24/2021   No results found for: FERRITIN, IRON, TIBC, UIBC, IRONPCTSAT Lab Results  Component Value Date   RBC 5.03 06/24/2021   Lab Results  Component Value Date   KPAFRELGTCHN 23.0 (H) 12/18/2020   LAMBDASER 17.6 12/18/2020   KAPLAMBRATIO 1.31 12/18/2020   Lab Results  Component Value Date   IGGSERUM 1,408 12/18/2020   IGA 440 (H) 12/18/2020   IGMSERUM 52 12/18/2020   Lab Results  Component Value Date   TOTALPROTELP 6.9 12/18/2020   ALBUMINELP 3.6 12/18/2020    A1GS 0.2 12/18/2020   A2GS 0.6 12/18/2020   BETS 1.3 12/18/2020   GAMS 1.2 12/18/2020   MSPIKE 0.5 (H) 12/18/2020   SPEI Comment 12/18/2020     Chemistry      Component Value Date/Time   NA 139 06/24/2021 0915   NA 139 03/07/2019 1100   K 3.8 06/24/2021 0915   CL 105 06/24/2021 0915   CO2 26 06/24/2021 0915   BUN 13 06/24/2021 0915   BUN 9 03/07/2019 1100   CREATININE 0.85 06/24/2021 0915      Component Value Date/Time   CALCIUM 9.8 06/24/2021 0915   ALKPHOS 74 06/24/2021 0915   AST 15 06/24/2021 0915   ALT 15 06/24/2021 0915   BILITOT 0.7 06/24/2021 0915       Impression and Plan: [name] is a pleasant 45yo African American gentleman with an IgG kappa MGUS, 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   I just do not think that this is going be a problem for him.  We will have to see what the monoclonal studies are.  I will plan to get him back in another 6 months.  I do not think that this monoclonal spike is going to be a problem for him.  I think that overall, his health will be dictated by his morphogenic status.   Volanda Napoleon, MD 1/26/202310:21 AM

## 2021-06-25 LAB — KAPPA/LAMBDA LIGHT CHAINS
Kappa free light chain: 29 mg/L — ABNORMAL HIGH (ref 3.3–19.4)
Kappa, lambda light chain ratio: 1.57 (ref 0.26–1.65)
Lambda free light chains: 18.5 mg/L (ref 5.7–26.3)

## 2021-06-25 LAB — IGG, IGA, IGM
IgA: 455 mg/dL — ABNORMAL HIGH (ref 90–386)
IgG (Immunoglobin G), Serum: 1474 mg/dL (ref 603–1613)
IgM (Immunoglobulin M), Srm: 60 mg/dL (ref 20–172)

## 2021-06-30 LAB — PROTEIN ELECTROPHORESIS, SERUM, WITH REFLEX
A/G Ratio: 0.8 (ref 0.7–1.7)
Albumin ELP: 3.2 g/dL (ref 2.9–4.4)
Alpha-1-Globulin: 0.3 g/dL (ref 0.0–0.4)
Alpha-2-Globulin: 0.7 g/dL (ref 0.4–1.0)
Beta Globulin: 1.3 g/dL (ref 0.7–1.3)
Gamma Globulin: 1.5 g/dL (ref 0.4–1.8)
Globulin, Total: 3.8 g/dL (ref 2.2–3.9)
M-Spike, %: 0.5 g/dL — ABNORMAL HIGH
SPEP Interpretation: 0
Total Protein ELP: 7 g/dL (ref 6.0–8.5)

## 2021-06-30 LAB — IMMUNOFIXATION REFLEX, SERUM
IgA: 561 mg/dL — ABNORMAL HIGH (ref 90–386)
IgG (Immunoglobin G), Serum: 1881 mg/dL — ABNORMAL HIGH (ref 603–1613)
IgM (Immunoglobulin M), Srm: 73 mg/dL (ref 20–172)

## 2021-07-06 ENCOUNTER — Ambulatory Visit: Payer: BLUE CROSS/BLUE SHIELD | Admitting: Family Medicine

## 2021-07-11 ENCOUNTER — Emergency Department (HOSPITAL_BASED_OUTPATIENT_CLINIC_OR_DEPARTMENT_OTHER)
Admission: EM | Admit: 2021-07-11 | Discharge: 2021-07-11 | Disposition: A | Discharge status: Home / Self Care | Payer: Commercial Managed Care - PPO | Attending: Emergency Medicine | Admitting: Emergency Medicine

## 2021-07-11 ENCOUNTER — Encounter (HOSPITAL_BASED_OUTPATIENT_CLINIC_OR_DEPARTMENT_OTHER): Payer: Self-pay | Admitting: *Deleted

## 2021-07-11 ENCOUNTER — Other Ambulatory Visit: Payer: Self-pay

## 2021-07-11 ENCOUNTER — Emergency Department (HOSPITAL_BASED_OUTPATIENT_CLINIC_OR_DEPARTMENT_OTHER): Payer: Commercial Managed Care - PPO

## 2021-07-11 DIAGNOSIS — S8392XA Sprain of unspecified site of left knee, initial encounter: Secondary | ICD-10-CM | POA: Diagnosis not present

## 2021-07-11 DIAGNOSIS — X501XXA Overexertion from prolonged static or awkward postures, initial encounter: Secondary | ICD-10-CM | POA: Diagnosis not present

## 2021-07-11 DIAGNOSIS — Y990 Civilian activity done for income or pay: Secondary | ICD-10-CM | POA: Insufficient documentation

## 2021-07-11 DIAGNOSIS — Y9301 Activity, walking, marching and hiking: Secondary | ICD-10-CM | POA: Diagnosis not present

## 2021-07-11 DIAGNOSIS — S8992XA Unspecified injury of left lower leg, initial encounter: Secondary | ICD-10-CM | POA: Diagnosis present

## 2021-07-11 NOTE — ED Provider Notes (Signed)
[facility] Provider Note   CSN: [id number] Arrival date & time: 07/11/21  1258     History  Chief Complaint  Patient presents with   Knee Pain    [name] is a 46 y.o. male.  The history is provided by the patient.  Knee Pain Location:  Knee Time since incident:  3 days Knee location:  L knee Pain details:    Quality:  Aching   Radiates to:  Does not radiate   Severity:  Mild   Onset quality:  Gradual   Timing:  Intermittent   Progression:  Waxing and waning Relieved by:  NSAIDs Worsened by:  Bearing weight Associated symptoms: stiffness   Associated symptoms: no back pain, no decreased ROM, no fatigue, no fever, no itching, no muscle weakness, no neck pain, no numbness, no swelling and no tingling       Home Medications Prior to Admission medications   Medication Sig Start Date End Date Taking? Authorizing Provider  acetaminophen (TYLENOL) 500 MG tablet Take 500 mg by mouth every 6 (six) hours as needed.    [provider]  Cholecalciferol (VITAMIN D3) 1.25 MG (50000 UT) CAPS Take 1 capsule by mouth once a week. 12/03/20   [provider]  fluticasone (FLONASE) 50 MCG/ACT nasal spray Place 2 sprays into both nostrils daily. 08/18/20   [provider]  Multiple Vitamins-Minerals (BARIATRIC MULTIVITAMINS/IRON PO) Take 1 tablet by mouth daily.    [provider]  nitroGLYCERIN (NITRODUR - DOSED IN MG/24 HR) 0.2 mg/hr patch Cut and apply 1/4 patch to most painful area q24h. Patient not taking: Reported on 06/24/2021 01/19/21   Rosemarie Ax, MD  sildenafil (VIAGRA) 50 MG tablet Take 50 mg by mouth as needed. 12/02/20   [provider]  triamcinolone cream (KENALOG) 0.1 % Apply topically. 12/01/20   [provider]      Allergies    Patient has no known allergies.    Review of Systems   Review of Systems  Constitutional:  Negative for fatigue and fever.  Musculoskeletal:  Positive  for stiffness. Negative for back pain and neck pain.  Skin:  Negative for itching.   Physical Exam Updated Vital Signs BP 116/75 (BP Location: Right Wrist)    Pulse 68    Temp 98.2 F (36.8 C) (Oral)    Resp 20    Ht 6\' 5"  (1.956 m)    Wt (!) 203.2 kg    SpO2 100%    BMI 53.13 kg/m  Physical Exam Constitutional:      General: He is not in acute distress.    Appearance: He is not ill-appearing.  Cardiovascular:     Pulses: Normal pulses.     Heart sounds: Normal heart sounds.  Musculoskeletal:        General: Tenderness present. No swelling. Normal range of motion.     Cervical back: Normal range of motion.  Skin:    General: Skin is warm.     Capillary Refill: Capillary refill takes less than 2 seconds.  Neurological:     Mental Status: He is alert.     Sensory: No sensory deficit.     Motor: No weakness.    ED Results / Procedures / Treatments   Labs (all labs ordered are listed, but only abnormal results are displayed) Labs Reviewed - No data to display  EKG None  Radiology DG Knee Complete 4 Views Left  Result Date: 07/11/2021 CLINICAL DATA:  Twisting injury to the left knee while walking down stairs. EXAM: LEFT KNEE - COMPLETE 4+ VIEW COMPARISON:  10/02/2019. FINDINGS: No fracture or bone lesion. Knee joint normally spaced and aligned. Minor marginal osteophytes from the patella. No joint effusion. Soft tissues are unremarkable. IMPRESSION: Negative. Electronically Signed   By: Lajean Manes M.D.   On: 07/11/2021 14:14    Procedures Procedures    Medications Ordered in ED Medications - No data to display  ED Course/ Medical Decision Making/ A&P                           Medical Decision Making Amount and/or Complexity of Data Reviewed Radiology: ordered.   [name] is here with left knee pain.  No major medical problems.  Has had pain in his left knee for the last several days.  Thinks that he might of injured it walking down some stairs at work.  Pain  mostly in the anterior portion of the left knee.  Differential diagnosis includes sprain versus meniscal injury versus soft tissue injury.  Less concern for fracture.  X-ray per my review and interpretation of his left knee shows no fracture or effusion.  Overall suspect a sprain/meniscal injury.  Recommend Tylenol, ice, ibuprofen, follow-up with sports medicine.  Neuromuscularly neurovascularly intact. .  Discharged in good condition.  This chart was dictated using voice recognition software.  Despite best efforts to proofread,  errors can occur which can change the documentation meaning.         Final Clinical Impression(s) / ED Diagnoses Final diagnoses:  Sprain of left knee, unspecified ligament, initial encounter    Rx / DC Orders ED Discharge Orders     None         Lennice Sites, DO 07/11/21 1524

## 2021-07-11 NOTE — Discharge Instructions (Signed)
Recommend Tylenol, ibuprofen, ice.  Follow-up with sports medicine doctor.

## 2021-07-11 NOTE — ED Triage Notes (Signed)
Pt c/o left knee pain and swelling after twisting it while walking down stairs at work on wed

## 2021-07-23 IMAGING — MR MR CERVICAL SPINE W/O CM
5 series · 29 of 48 positions shown · non-contrast
Comparison: None.

CLINICAL DATA: Neck pain with numbness in the arms, hands and legs.
Symptoms are chronic. No known injury.

EXAM:
MRI CERVICAL SPINE WITHOUT CONTRAST
TECHNIQUE: Multiplanar, multisequence MR imaging of the cervical spine was
performed. No intravenous contrast was administered.

[Series 6: T1 · sagittal · 3.0mm · 0.66mm/px · 6 of 15 slices shown]
[im 1/15]
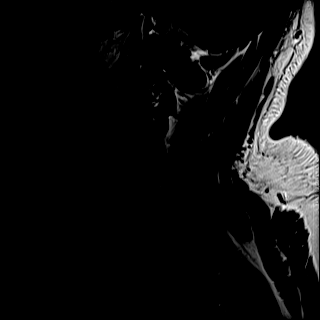
[im 3/15]
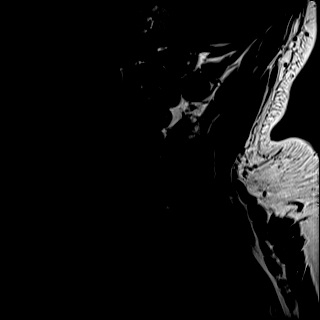
[im 6/15]
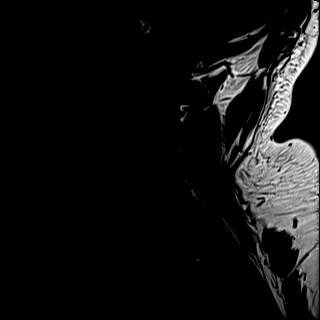
[im 9/15]
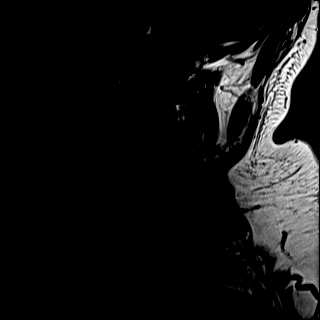
[im 12/15]
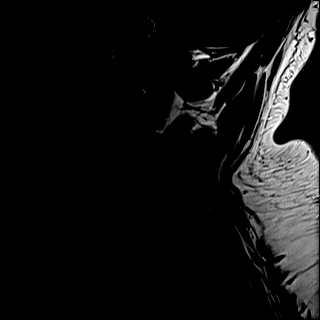
[im 15/15]
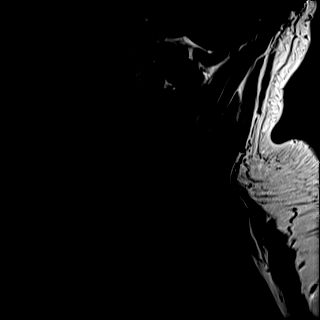

[Series 7: T2 · sagittal · 3.0mm · 0.55mm/px · 6 of 15 slices shown (1 of 2)]
[im 1/15]
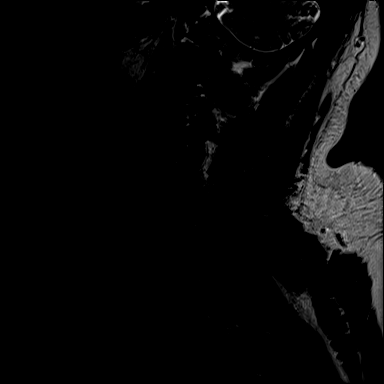
[im 3/15]
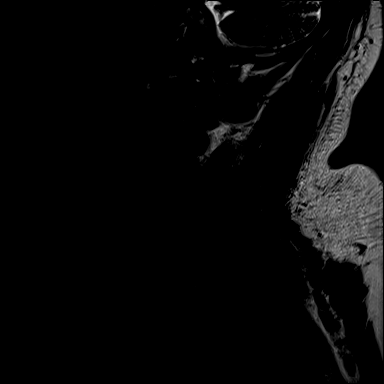
[im 6/15]
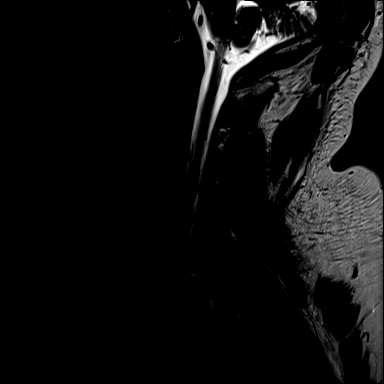
[im 9/15]
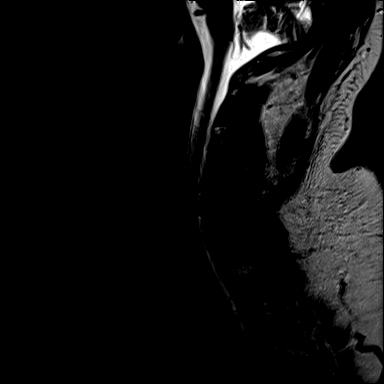
[im 12/15]
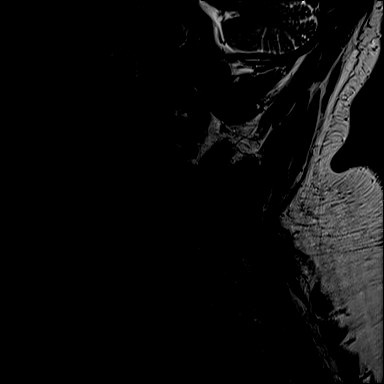
[im 15/15]
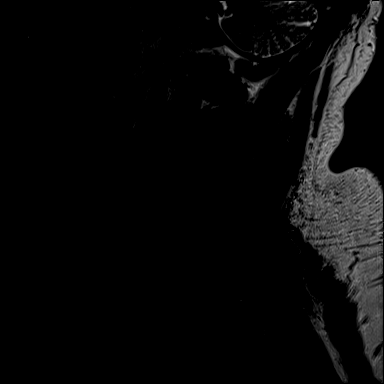

[Series 8: STIR · sagittal · 3.0mm · 0.33mm/px · 6 of 15 slices shown]
[im 1/15]
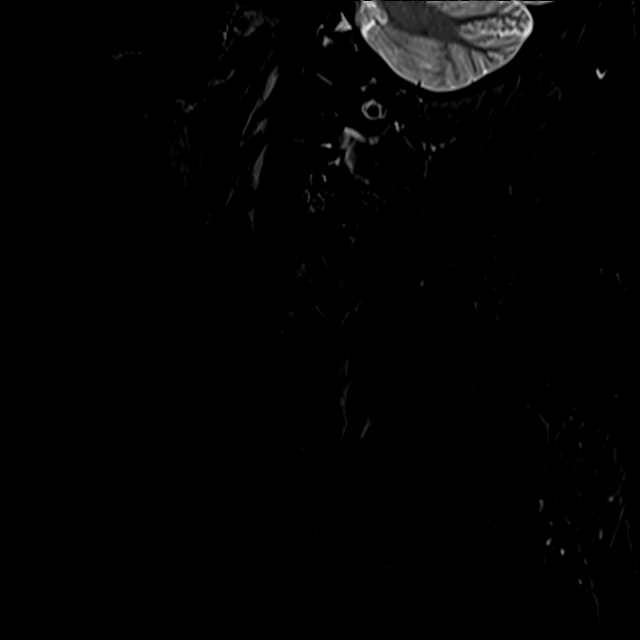
[im 3/15]
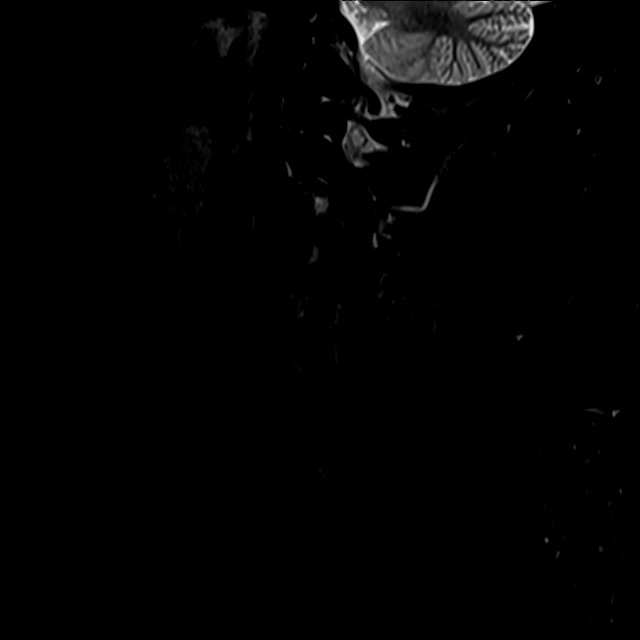
[im 6/15]
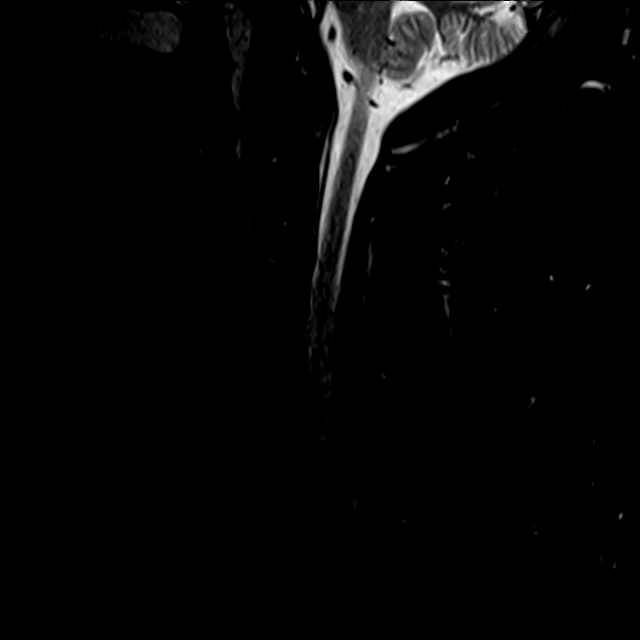
[im 9/15]
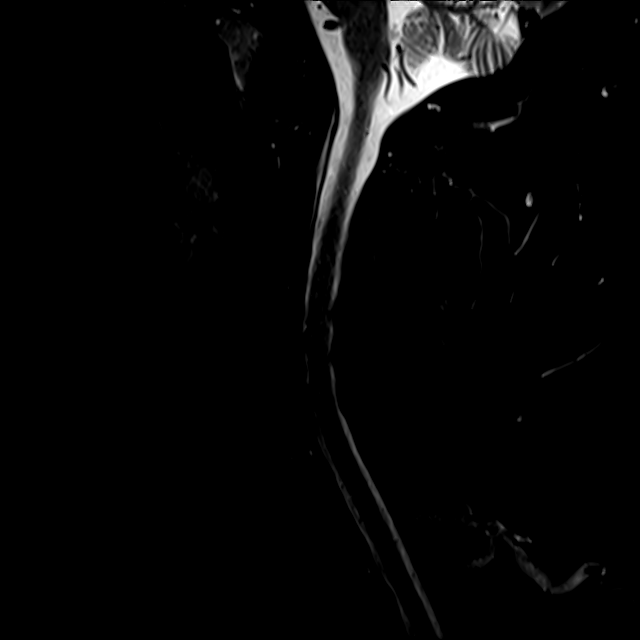
[im 12/15]
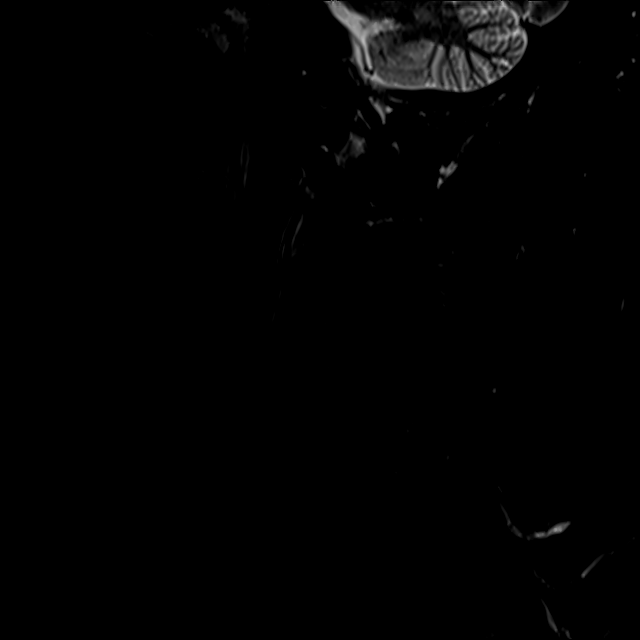
[im 15/15]
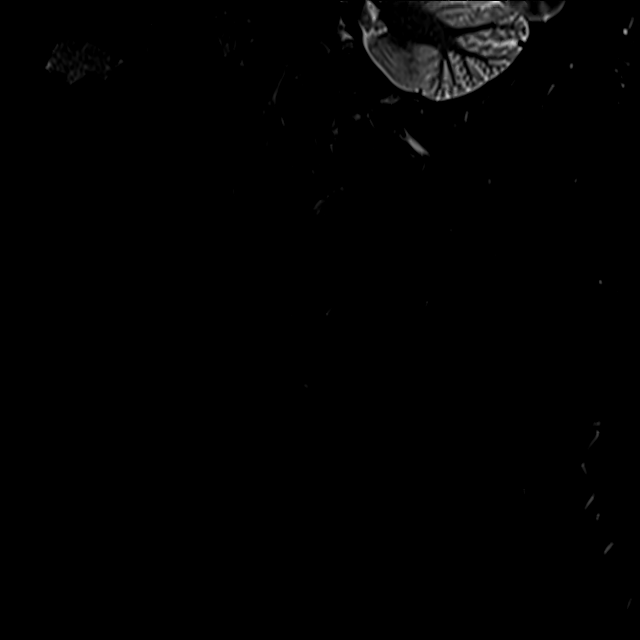

[Series 9: T2 · axial · 3.0mm · 0.50mm/px · z∈[-102,+8]mm · 9 of 35 slices shown (2 of 2)]
[im 1/35]
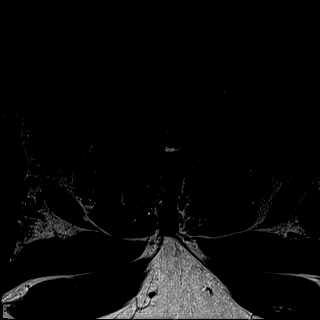
[im 5/35]
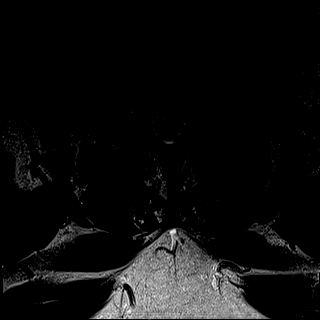
[im 10/35]
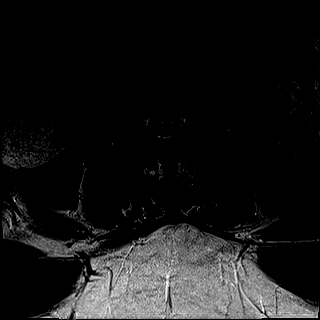
[im 15/35]
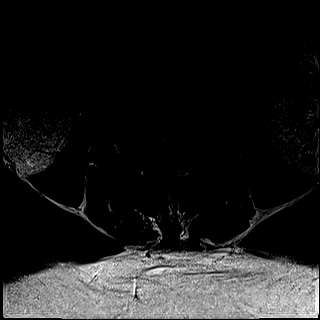
[im 18/35]
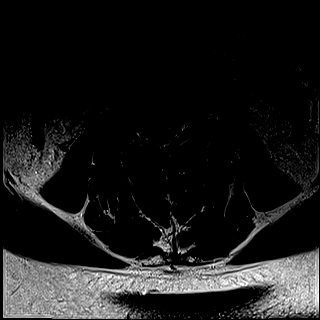
[im 20/35]
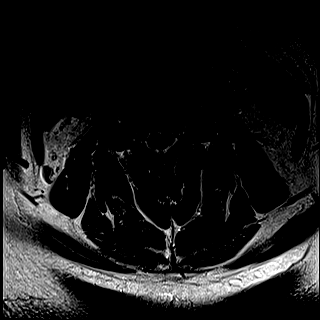
[im 25/35]
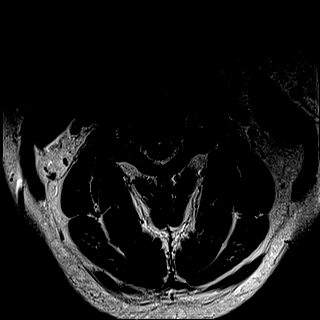
[im 30/35]
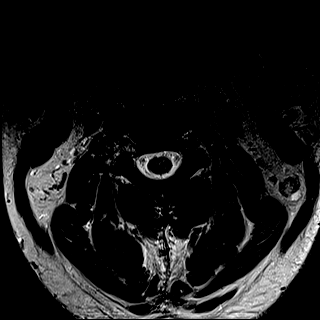
[im 35/35]
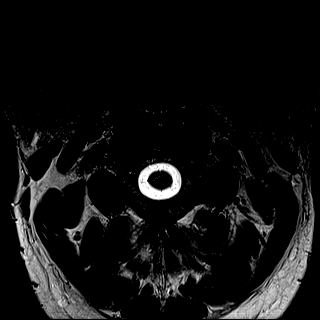

[Series 10: GRE · axial · 3.0mm · 0.42mm/px · z∈[-102,-89]mm · 2 of 35 slices shown]
[im 1/35]
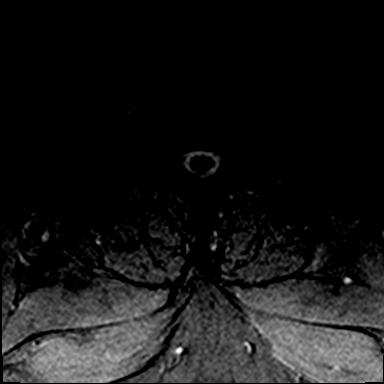
[im 5/35]
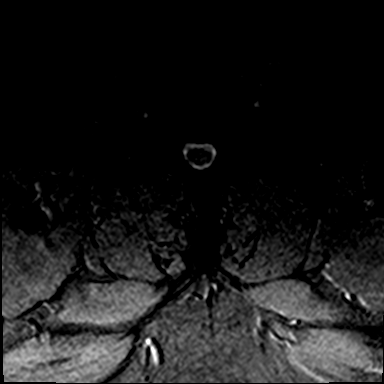

[29 of 48 positions shown; findings below may reference images not displayed]

FINDINGS: Alignment: Maintained.

Vertebrae: No fracture, evidence of discitis, or bone lesion.

Cord: Normal signal throughout.

Posterior Fossa, vertebral arteries, paraspinal tissues: Negative.

Disc levels:

Some images are degraded by patient motion.

C2-3: Negative.

C3-4: Minimal disc bulge without stenosis.

C4-5: Negative.

C5-6: Minimal disc bulge without stenosis.

C6-7: Minimal disc bulge without stenosis.

C7-T1: Negative.
IMPRESSION: No finding to explain the patient's symptoms. Mild cervical
spondylosis without central canal or foraminal narrowing.

## 2021-08-03 ENCOUNTER — Ambulatory Visit: Payer: BLUE CROSS/BLUE SHIELD | Admitting: Family Medicine

## 2021-09-13 ENCOUNTER — Ambulatory Visit: Payer: Self-pay

## 2021-09-13 ENCOUNTER — Ambulatory Visit (INDEPENDENT_AMBULATORY_CARE_PROVIDER_SITE_OTHER): Payer: Commercial Managed Care - PPO | Admitting: Family Medicine

## 2021-09-13 ENCOUNTER — Telehealth: Payer: Self-pay | Admitting: Family Medicine

## 2021-09-13 VITALS — BP 153/84 | Ht 76.0 in | Wt >= 6400 oz

## 2021-09-13 DIAGNOSIS — M222X2 Patellofemoral disorders, left knee: Secondary | ICD-10-CM | POA: Diagnosis not present

## 2021-09-13 DIAGNOSIS — M25562 Pain in left knee: Secondary | ICD-10-CM

## 2021-09-13 MED ORDER — PREDNISONE 5 MG PO TABS: ORAL_TABLET | ORAL | 0 refills | Status: DC

## 2021-09-13 NOTE — Patient Instructions (Signed)
Good to see you ?Please use ice as needed  ?Please try the exercises   ?Please send me a message in MyChart with any questions or updates.  ?Please see me back to have orthotics made.  ? ?--Dr. Raeford Razor ? ?

## 2021-09-13 NOTE — Telephone Encounter (Signed)
Pt cld states he needs his RTW note amended to say he needs to wear Tennis Shoes instead of Work boots.. per pt,(Employer read his Work note for [employer] to say he is unable to perform MWA & have taken him out of work) ? ?--Forwarding request to med asst to amend - Pt will obtain thru mychart. ? ?-glh ? ?

## 2021-09-13 NOTE — Progress Notes (Signed)
?  [name] - 46 y.o. male MRN [id number]  Date of birth: 1975-09-22 ? ?SUBJECTIVE:  Including CC & ROS.  ?No chief complaint on file. ? ? ?[name] is a 46 y.o. male that is presenting with acute worsening of left knee pain.  The pain is anterior in nature.  It is worse with walking several steps.  ? ?Reviewed the note from the emergency department on 2/12 shows he was counseled supportive care. ?Independent review of the left knee x-ray from 2/12 shows no acute changes. ? ? ?Review of Systems ?See HPI  ? ?HISTORY: Past Medical, Surgical, Social, and Family History Reviewed & Updated per EMR.   ?Pertinent Historical Findings include: ? ?Past Medical History:  ?Diagnosis Date  ? Eczema   ? Goals of care, counseling/discussion 12/18/2020  ? MGUS (monoclonal gammopathy of unknown significance) 12/18/2020  ? Pulmonary emboli ([facility])   ? ? ?Past Surgical History:  ?Procedure Laterality Date  ? IR EPIDUROGRAPHY  12/11/2018  ? KNEE SURGERY    ? right  ? LAPAROSCOPIC GASTRIC BAND REMOVAL WITH LAPAROSCOPIC GASTRIC SLEEVE RESECTION    ? WRIST FRACTURE SURGERY    ? ? ? ?PHYSICAL EXAM:  ?VS: BP (!) 153/84   Ht '6\' 4"'$  (1.93 m)   Wt (!) 415 lb (188.2 kg)   BMI 50.52 kg/m?  ?Physical Exam ?Gen: NAD, alert, cooperative with exam, well-appearing ?MSK:  ?Neurovascularly intact   ? ?Limited ultrasound: Left knee: ? ?No effusion suprapatellar pouch. ?Normal-appearing quadricep tendon. ?Chronic changes at the tibial tubercle. ?No significant changes at the medial or lateral joint space ? ?Summary: No acute changes appreciated ? ?Ultrasound and interpretation by Clearance Coots, MD ? ? ? ?ASSESSMENT & PLAN:  ? ?Patellofemoral pain syndrome of left knee ?Acute on chronic in nature.  No significant structural changes appreciated today.  He has been walking more with work with achieving around 40,981 steps on certain days.  This is likely contributing to his current pain. ?-Counseled on home exercise therapy and supportive  care. ?-Prednisone. ?-Provided work note. ?-Could consider injection or physical therapy. ? ? ? ? ?

## 2021-09-13 NOTE — Assessment & Plan Note (Signed)
Acute on chronic in nature.  No significant structural changes appreciated today.  He has been walking more with work with achieving around 41,660 steps on certain days.  This is likely contributing to his current pain. ?-Counseled on home exercise therapy and supportive care. ?-Prednisone. ?-Provided work note. ?-Could consider injection or physical therapy. ?

## 2021-09-14 ENCOUNTER — Ambulatory Visit: Payer: BLUE CROSS/BLUE SHIELD | Admitting: Family Medicine

## 2021-09-15 NOTE — Telephone Encounter (Signed)
Letter updated.

## 2021-09-16 ENCOUNTER — Ambulatory Visit (INDEPENDENT_AMBULATORY_CARE_PROVIDER_SITE_OTHER): Payer: Commercial Managed Care - PPO | Admitting: Family Medicine

## 2021-09-16 ENCOUNTER — Encounter: Payer: Self-pay | Admitting: Family Medicine

## 2021-09-16 DIAGNOSIS — M222X2 Patellofemoral disorders, left knee: Secondary | ICD-10-CM | POA: Diagnosis not present

## 2021-09-16 NOTE — Assessment & Plan Note (Signed)
Completed orthotics today.  

## 2021-09-16 NOTE — Progress Notes (Signed)
?  [name] - 46 y.o. male MRN [id number]  Date of birth: November 28, 1975 ? ?SUBJECTIVE:  Including CC & ROS.  ?No chief complaint on file. ? ? ?[name] is a 46 y.o. male that is  here for orthotics. ? ? ? ?Review of Systems ?See HPI  ? ?HISTORY: Past Medical, Surgical, Social, and Family History Reviewed & Updated per EMR.   ?Pertinent Historical Findings include: ? ?Past Medical History:  ?Diagnosis Date  ? Eczema   ? Goals of care, counseling/discussion 12/18/2020  ? MGUS (monoclonal gammopathy of unknown significance) 12/18/2020  ? Pulmonary emboli ([place])   ? ? ?Past Surgical History:  ?Procedure Laterality Date  ? IR EPIDUROGRAPHY  12/11/2018  ? KNEE SURGERY    ? right  ? LAPAROSCOPIC GASTRIC BAND REMOVAL WITH LAPAROSCOPIC GASTRIC SLEEVE RESECTION    ? WRIST FRACTURE SURGERY    ? ? ? ?PHYSICAL EXAM:  ?VS: Ht '6\' 4"'$  (1.93 m)   Wt (!) 415 lb (188.2 kg)   BMI 50.52 kg/m?  ?Physical Exam ?Gen: NAD, alert, cooperative with exam, well-appearing ?MSK:  ?Neurovascularly intact   ? ?Patient was fitted for a standard, cushioned, semi-rigid orthotic. ?The orthotic was heated and afterward the patient stood on the orthotic blank positioned on the orthotic stand. ?The patient was positioned in subtalar neutral position and 10 degrees of ankle dorsiflexion in a weight bearing stance. ?After completion of molding, a stable base was applied to the orthotic blank. ?The blank was ground to a stable position for weight bearing. ?Size: 16 ?Pairs: 2 ?Base: cork ?Additional Posting and Padding: None ?The patient ambulated these, and they were very comfortable. ? ? ? ?ASSESSMENT & PLAN:  ? ?Patellofemoral pain syndrome of left knee ?Completed orthotics today ? ? ? ? ?

## 2021-10-11 ENCOUNTER — Encounter: Payer: BLUE CROSS/BLUE SHIELD | Admitting: Family Medicine

## 2021-12-22 ENCOUNTER — Inpatient Hospital Stay: Payer: Commercial Managed Care - PPO

## 2021-12-30 ENCOUNTER — Inpatient Hospital Stay: Payer: Commercial Managed Care - PPO | Attending: Hematology & Oncology

## 2021-12-30 ENCOUNTER — Other Ambulatory Visit: Payer: Self-pay

## 2021-12-30 ENCOUNTER — Inpatient Hospital Stay (HOSPITAL_BASED_OUTPATIENT_CLINIC_OR_DEPARTMENT_OTHER): Payer: Commercial Managed Care - PPO | Admitting: Hematology & Oncology

## 2021-12-30 ENCOUNTER — Encounter: Payer: Self-pay | Admitting: Hematology & Oncology

## 2021-12-30 VITALS — BP 128/82 | HR 71 | Temp 98.1°F | Resp 18 | Ht 75.0 in | Wt >= 6400 oz

## 2021-12-30 DIAGNOSIS — D472 Monoclonal gammopathy: Secondary | ICD-10-CM | POA: Diagnosis not present

## 2021-12-30 LAB — CBC WITH DIFFERENTIAL (CANCER CENTER ONLY)
Abs Immature Granulocytes: 0.05 10*3/uL (ref 0.00–0.07)
Basophils Absolute: 0.1 10*3/uL (ref 0.0–0.1)
Basophils Relative: 1 %
Eosinophils Absolute: 0.2 10*3/uL (ref 0.0–0.5)
Eosinophils Relative: 2 %
HCT: 44.6 % (ref 39.0–52.0)
Hemoglobin: 14.8 g/dL (ref 13.0–17.0)
Immature Granulocytes: 1 %
Lymphocytes Relative: 27 %
Lymphs Abs: 2.3 10*3/uL (ref 0.7–4.0)
MCH: 29.4 pg (ref 26.0–34.0)
MCHC: 33.2 g/dL (ref 30.0–36.0)
MCV: 88.7 fL (ref 80.0–100.0)
Monocytes Absolute: 0.6 10*3/uL (ref 0.1–1.0)
Monocytes Relative: 7 %
Neutro Abs: 5.3 10*3/uL (ref 1.7–7.7)
Neutrophils Relative %: 62 %
Platelet Count: 242 10*3/uL (ref 150–400)
RBC: 5.03 MIL/uL (ref 4.22–5.81)
RDW: 13 % (ref 11.5–15.5)
WBC Count: 8.5 10*3/uL (ref 4.0–10.5)
nRBC: 0 % (ref 0.0–0.2)

## 2021-12-30 LAB — CMP (CANCER CENTER ONLY)
ALT: 20 U/L (ref 0–44)
AST: 15 U/L (ref 15–41)
Albumin: 4 g/dL (ref 3.5–5.0)
Alkaline Phosphatase: 67 U/L (ref 38–126)
Anion gap: 7 (ref 5–15)
BUN: 12 mg/dL (ref 6–20)
CO2: 26 mmol/L (ref 22–32)
Calcium: 9.2 mg/dL (ref 8.9–10.3)
Chloride: 104 mmol/L (ref 98–111)
Creatinine: 0.93 mg/dL (ref 0.61–1.24)
GFR, Estimated: >60 mL/min (ref >60)
Glucose, Bld: 131 mg/dL — ABNORMAL HIGH (ref 70–99)
Potassium: 3.7 mmol/L (ref 3.5–5.1)
Sodium: 137 mmol/L (ref 135–145)
Total Bilirubin: 1 mg/dL (ref 0.3–1.2)
Total Protein: 7.2 g/dL (ref 6.5–8.1)

## 2021-12-30 LAB — LACTATE DEHYDROGENASE: LDH: 130 U/L (ref 98–192)

## 2021-12-30 NOTE — Progress Notes (Signed)
Hematology and Oncology Follow Up Visit  [name] [id number] 03-24-1976 46 y.o. 12/30/2021   Principle Diagnosis:  IgG kappa MGUS   Current Therapy:        Observation   Interim History:  [name] is here today for follow-up.  We saw him 6 months ago.  He is still working for the [employer]  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  He is doing well.  He has had no problems since we last saw him.  He and his wife are in [place] for a little vacation.  That a good time.  Over last saw him back in January, his monoclonal spike is still stable at 0.5 g/dL.  His IgG level was '1881mg'$ /dL.  The IgA level is also a little bit at 561 mg/dL.  The Kappa light chain was 2.9 mg/dL.  He has had no problem with infections.  He has had no problems with cough or shortness of breath.  He has had no nausea or vomiting.  There is been no change in bowel or bladder habits.  He has had no rashes.  Overall, I would have said his performance status is probably ECOG 0.    Medications:  Allergies as of 12/30/2021   No Known Allergies      Medication List        Accurate as of December 30, 2021 12:18 PM. If you have any questions, ask your nurse or doctor.          STOP taking these medications    predniSONE 5 MG tablet Commonly known as: DELTASONE Stopped by: Volanda Napoleon, MD       TAKE these medications    acetaminophen 500 MG tablet Commonly known as: TYLENOL Take 500 mg by mouth every 6 (six) hours as needed.   BARIATRIC MULTIVITAMINS/IRON PO Take 1 tablet by mouth daily.   fluticasone 50 MCG/ACT nasal spray Commonly known as: FLONASE Place 2 sprays into both nostrils daily.   nitroGLYCERIN 0.2 mg/hr patch Commonly known as: NITRODUR - Dosed in mg/24 hr Cut and apply 1/4 patch to most painful area q24h.   sildenafil 50 MG tablet Commonly known as: VIAGRA Take 50 mg by mouth as needed.   triamcinolone cream 0.1 % Commonly known as:  KENALOG Apply topically.   Vitamin D3 1.25 MG (50000 UT) Caps Take 1 capsule by mouth once a week.        Allergies: No Known Allergies  Past Medical History, Surgical history, Social history, and Family History were reviewed and updated.  Review of Systems: Review of Systems  Constitutional: Negative.   HENT: Negative.    Eyes: Negative.   Respiratory: Negative.    Cardiovascular: Negative.   Gastrointestinal: Negative.   Genitourinary: Negative.   Musculoskeletal: Negative.   Skin: Negative.   Neurological:  Positive for tingling.  Endo/Heme/Allergies: Negative.   Psychiatric/Behavioral: Negative.       Physical Exam:  height is '6\' 3"'$  (1.905 m) and weight is 470 lb 1.3 oz (213.2 kg) (abnormal). His oral temperature is 98.1 F (36.7 C). His blood pressure is 128/82 and his pulse is 71. His respiration is 18 and oxygen saturation is 99%.   Wt Readings from Last 3 Encounters:  12/30/21 (!) 470 lb 1.3 oz (213.2 kg)  09/16/21 (!) 415 lb (188.2 kg)  09/13/21 (!) 415 lb (188.2 kg)    Physical Exam Vitals reviewed.  HENT:     Head: Normocephalic and atraumatic.  Eyes:     Pupils: Pupils are equal, round, and reactive to light.  Cardiovascular:     Rate and Rhythm: Normal rate and regular rhythm.     Heart sounds: Normal heart sounds.  Pulmonary:     Effort: Pulmonary effort is normal.     Breath sounds: Normal breath sounds.  Abdominal:     General: Bowel sounds are normal.     Palpations: Abdomen is soft.  Musculoskeletal:        General: No tenderness or deformity. Normal range of motion.     Cervical back: Normal range of motion.  Lymphadenopathy:     Cervical: No cervical adenopathy.  Skin:    General: Skin is warm and dry.     Findings: No erythema or rash.  Neurological:     Mental Status: He is alert and oriented to person, place, and time.  Psychiatric:        Behavior: Behavior normal.        Thought Content: Thought content normal.         Judgment: Judgment normal.      Lab Results  Component Value Date   WBC 8.5 12/30/2021   HGB 14.8 12/30/2021   HCT 44.6 12/30/2021   MCV 88.7 12/30/2021   PLT 242 12/30/2021   No results found for: "FERRITIN", "IRON", "TIBC", "UIBC", "IRONPCTSAT" Lab Results  Component Value Date   RBC 5.03 12/30/2021   Lab Results  Component Value Date   KPAFRELGTCHN 29.0 (H) 06/24/2021   LAMBDASER 18.5 06/24/2021   KAPLAMBRATIO 1.57 06/24/2021   Lab Results  Component Value Date   IGGSERUM 1,881 (H) 06/24/2021   IGA 561 (H) 06/24/2021   IGMSERUM 73 06/24/2021   Lab Results  Component Value Date   TOTALPROTELP 7.0 06/24/2021   ALBUMINELP 3.2 06/24/2021   A1GS 0.3 06/24/2021   A2GS 0.7 06/24/2021   BETS 1.3 06/24/2021   GAMS 1.5 06/24/2021   MSPIKE 0.5 (H) 06/24/2021   SPEI Comment 12/18/2020     Chemistry      Component Value Date/Time   NA 137 12/30/2021 1046   NA 139 03/07/2019 1100   K 3.7 12/30/2021 1046   CL 104 12/30/2021 1046   CO2 26 12/30/2021 1046   BUN 12 12/30/2021 1046   BUN 9 03/07/2019 1100   CREATININE 0.93 12/30/2021 1046      Component Value Date/Time   CALCIUM 9.2 12/30/2021 1046   ALKPHOS 67 12/30/2021 1046   AST 15 12/30/2021 1046   ALT 20 12/30/2021 1046   BILITOT 1.0 12/30/2021 1046       Impression and Plan: [name] is a pleasant 46yo African American gentleman with an IgG kappa MGUS, 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  We will have to see what his numbers look like this time.  Again, I would like to think that they will be holding steady.  I really think that we can just follow him up in a year now.  He looks good.  His numbers I would think will look okay.  I just cannot imagine that there can be all that abnormal.  He is very nice.  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     Volanda Napoleon, MD 8/3/202312:18 PM

## 2021-12-31 LAB — KAPPA/LAMBDA LIGHT CHAINS
Kappa free light chain: 28.1 mg/L — ABNORMAL HIGH (ref 3.3–19.4)
Kappa, lambda light chain ratio: 1.5 (ref 0.26–1.65)
Lambda free light chains: 18.7 mg/L (ref 5.7–26.3)

## 2021-12-31 LAB — IGG, IGA, IGM
IgA: 484 mg/dL — ABNORMAL HIGH (ref 90–386)
IgG (Immunoglobin G), Serum: 1305 mg/dL (ref 603–1613)
IgM (Immunoglobulin M), Srm: 57 mg/dL (ref 20–172)

## 2022-01-06 LAB — PROTEIN ELECTROPHORESIS, SERUM, WITH REFLEX
A/G Ratio: 1 (ref 0.7–1.7)
Albumin ELP: 3.4 g/dL (ref 2.9–4.4)
Alpha-1-Globulin: 0.2 g/dL (ref 0.0–0.4)
Alpha-2-Globulin: 0.7 g/dL (ref 0.4–1.0)
Beta Globulin: 1.3 g/dL (ref 0.7–1.3)
Gamma Globulin: 1.3 g/dL (ref 0.4–1.8)
Globulin, Total: 3.5 g/dL (ref 2.2–3.9)
M-Spike, %: 0.5 g/dL — ABNORMAL HIGH
SPEP Interpretation: 0
Total Protein ELP: 6.9 g/dL (ref 6.0–8.5)

## 2022-01-06 LAB — IMMUNOFIXATION REFLEX, SERUM
IgA: 502 mg/dL — ABNORMAL HIGH (ref 90–386)
IgG (Immunoglobin G), Serum: 1494 mg/dL (ref 603–1613)
IgM (Immunoglobulin M), Srm: 60 mg/dL (ref 20–172)

## 2022-01-25 LAB — COLOGUARD: COLOGUARD: NEGATIVE

## 2022-06-08 ENCOUNTER — Ambulatory Visit: Payer: Commercial Managed Care - PPO | Admitting: Family Medicine

## 2022-06-09 ENCOUNTER — Ambulatory Visit: Payer: Commercial Managed Care - PPO | Admitting: Family Medicine

## 2022-06-10 ENCOUNTER — Encounter: Payer: Self-pay | Admitting: Family Medicine

## 2022-06-10 ENCOUNTER — Ambulatory Visit: Payer: Commercial Managed Care - PPO | Admitting: Family Medicine

## 2022-06-10 ENCOUNTER — Ambulatory Visit: Payer: Self-pay

## 2022-06-10 VITALS — BP 146/88 | Ht 76.0 in | Wt 350.0 lb

## 2022-06-10 DIAGNOSIS — M654 Radial styloid tenosynovitis [de Quervain]: Secondary | ICD-10-CM

## 2022-06-10 MED ORDER — METHYLPREDNISOLONE ACETATE 40 MG/ML IJ SUSP
40.0000 mg | Freq: Once | INTRAMUSCULAR | Status: AC
  Administered 2022-06-10: 40 mg via INTRAMUSCULAR

## 2022-06-10 NOTE — Patient Instructions (Signed)
Good to see you Please use ice as needed  Please try the exercises  We'll call with the xray results.   Please send me a message in MyChart with any questions or updates.  Please see me back in 4 weeks or as needed if better.   --Dr. Raeford Razor

## 2022-06-10 NOTE — Progress Notes (Signed)
  [name] - 47 y.o. male MRN [id number]  Date of birth: [birth year]  SUBJECTIVE:  Including CC & ROS.  No chief complaint on file.   [name] is a 47 y.o. male that is presenting with acute wrist pain.  Similar to his previous experience of pain.  He did well with the previous injection.  No injury inciting event.   Review of Systems See HPI   HISTORY: Past Medical, Surgical, Social, and Family History Reviewed & Updated per EMR.   Pertinent Historical Findings include:  Past Medical History:  Diagnosis Date   Eczema    Goals of care, counseling/discussion 12/18/2020   MGUS (monoclonal gammopathy of unknown significance) 12/18/2020   Pulmonary emboli [facility])     Past Surgical History:  Procedure Laterality Date   IR EPIDUROGRAPHY  12/11/2018   KNEE SURGERY     right   LAPAROSCOPIC GASTRIC BAND REMOVAL WITH LAPAROSCOPIC GASTRIC SLEEVE RESECTION     WRIST FRACTURE SURGERY       PHYSICAL EXAM:  VS: BP (!) 146/88   Ht '6\' 4"'$  (1.93 m)   Wt (!) 350 lb (158.8 kg)   BMI 42.60 kg/m  Physical Exam Gen: NAD, alert, cooperative with exam, well-appearing MSK:  Neurovascularly intact    Limited ultrasound: Right wrist pain:  Effusion and hyperemia of the first dorsal compartment.  Summary: Findings consistent with de Quervain's tenosynovitis.  Ultrasound and interpretation by Clearance Coots, MD  Aspiration/Injection Procedure Note [name] Sep 26, 1975  Procedure: Injection Indications: Right wrist pain  Procedure Details Consent: Risks of procedure as well as the alternatives and risks of each were explained to the (patient/caregiver).  Consent for procedure obtained. Time Out: Verified patient identification, verified procedure, site/side was marked, verified correct patient position, special equipment/implants available, medications/allergies/relevent history reviewed, required imaging and test results available.  Performed.  The area was cleaned with iodine and  alcohol swabs.    The right first dorsal compartment was injected using 1 cc of 1% lidocaine on a 25-gauge 1-1/2 inch needle.  The syringe was switched to mixture containing 1 cc's of 40 mg Depo-medrol and 1 cc's of 0.25% bupivacaine was injected.  Ultrasound was used. Images were obtained in long views showing the injection.     A sterile dressing was applied.  Patient did tolerate procedure well.     ASSESSMENT & PLAN:   De Quervain's tenosynovitis, right Acutely occurring.  Effusion and hyperemia appreciated on ultrasound.  Positive Finkelstein's test. -Counseled on home exercise therapy and supportive care. -Injection today. -Could consider physical therapy or lab work.

## 2022-06-10 NOTE — Assessment & Plan Note (Signed)
Acutely occurring.  Effusion and hyperemia appreciated on ultrasound.  Positive Finkelstein's test. -Counseled on home exercise therapy and supportive care. -Injection today. -Could consider physical therapy or lab work.

## 2022-09-12 ENCOUNTER — Encounter: Payer: Self-pay | Admitting: *Deleted

## 2022-12-29 ENCOUNTER — Inpatient Hospital Stay (HOSPITAL_BASED_OUTPATIENT_CLINIC_OR_DEPARTMENT_OTHER): Payer: Commercial Managed Care - PPO | Admitting: Medical Oncology

## 2022-12-29 ENCOUNTER — Inpatient Hospital Stay: Payer: Commercial Managed Care - PPO | Attending: Hematology & Oncology

## 2022-12-29 VITALS — BP 111/71 | HR 71 | Temp 98.0°F | Resp 17 | Wt >= 6400 oz

## 2022-12-29 DIAGNOSIS — D472 Monoclonal gammopathy: Secondary | ICD-10-CM | POA: Insufficient documentation

## 2022-12-29 LAB — CMP (CANCER CENTER ONLY)
ALT: 17 U/L (ref 0–44)
AST: 14 U/L — ABNORMAL LOW (ref 15–41)
Albumin: 3.7 g/dL (ref 3.5–5.0)
Alkaline Phosphatase: 70 U/L (ref 38–126)
Anion gap: 9 (ref 5–15)
BUN: 12 mg/dL (ref 6–20)
CO2: 23 mmol/L (ref 22–32)
Calcium: 8.7 mg/dL — ABNORMAL LOW (ref 8.9–10.3)
Chloride: 106 mmol/L (ref 98–111)
Creatinine: 0.84 mg/dL (ref 0.61–1.24)
GFR, Estimated: >60 mL/min (ref >60)
Glucose, Bld: 147 mg/dL — ABNORMAL HIGH (ref 70–99)
Potassium: 3.7 mmol/L (ref 3.5–5.1)
Sodium: 138 mmol/L (ref 135–145)
Total Bilirubin: 0.8 mg/dL (ref 0.3–1.2)
Total Protein: 7.3 g/dL (ref 6.5–8.1)

## 2022-12-29 LAB — LACTATE DEHYDROGENASE: LDH: 135 U/L (ref 98–192)

## 2022-12-29 LAB — CBC WITH DIFFERENTIAL (CANCER CENTER ONLY)
Abs Immature Granulocytes: 0.05 10*3/uL (ref 0.00–0.07)
Basophils Absolute: 0.1 10*3/uL (ref 0.0–0.1)
Basophils Relative: 1 %
Eosinophils Absolute: 0.3 10*3/uL (ref 0.0–0.5)
Eosinophils Relative: 4 %
HCT: 42.5 % (ref 39.0–52.0)
Hemoglobin: 14.1 g/dL (ref 13.0–17.0)
Immature Granulocytes: 1 %
Lymphocytes Relative: 29 %
Lymphs Abs: 2.3 10*3/uL (ref 0.7–4.0)
MCH: 29.4 pg (ref 26.0–34.0)
MCHC: 33.2 g/dL (ref 30.0–36.0)
MCV: 88.7 fL (ref 80.0–100.0)
Monocytes Absolute: 0.5 10*3/uL (ref 0.1–1.0)
Monocytes Relative: 7 %
Neutro Abs: 4.7 10*3/uL (ref 1.7–7.7)
Neutrophils Relative %: 58 %
Platelet Count: 253 10*3/uL (ref 150–400)
RBC: 4.79 MIL/uL (ref 4.22–5.81)
RDW: 13.5 % (ref 11.5–15.5)
WBC Count: 8 10*3/uL (ref 4.0–10.5)
nRBC: 0 % (ref 0.0–0.2)

## 2022-12-29 NOTE — Progress Notes (Signed)
Hematology and Oncology Follow Up Visit  Brooklyn Hey [id number] 05-14-1976 47 y.o. 12/29/2022   Principle Diagnosis:  IgG kappa MGUS   Current Therapy:        Observation   Interim History:  Mr. Urwin is here today for follow-up. We last saw him in August 2023  He is doing well although he is concerned that he has transitioned from pre-diabetic to diabetic as he is more thirsty and subsequently urinating more.   Over last saw him back in August of 2023, his monoclonal spike is still stable at 0.5 g/dL.  His IgG level was 1305 mg/dL.  The IgA level is also a little bit at 561 mg/dL.  The Kappa light chain was 2.8 mg/dL.  He has had no problem with infections.  He has had no problems with cough or shortness of breath.  He has had no nausea or vomiting.  There is been no change in bowel or bladder habits.  He has had no rashes.  Overall, I would have said his performance status is probably ECOG 0.    Wt Readings from Last 3 Encounters:  12/29/22 (!) 483 lb (219.1 kg)  06/10/22 (!) 350 lb (158.8 kg)  12/30/21 (!) 470 lb 1.3 oz (213.2 kg)     Medications:  Allergies as of 12/29/2022   No Known Allergies      Medication List        Accurate as of December 29, 2022 11:32 AM. If you have any questions, ask your nurse or doctor.          acetaminophen 500 MG tablet Commonly known as: TYLENOL Take 500 mg by mouth every 6 (six) hours as needed.   BARIATRIC MULTIVITAMINS/IRON PO Take 1 tablet by mouth daily.   fluticasone 50 MCG/ACT nasal spray Commonly known as: FLONASE Place 2 sprays into both nostrils daily.   nitroGLYCERIN 0.2 mg/hr patch Commonly known as: NITRODUR - Dosed in mg/24 hr Cut and apply 1/4 patch to most painful area q24h.   sildenafil 50 MG tablet Commonly known as: VIAGRA Take 50 mg by mouth as needed.   triamcinolone cream 0.1 % Commonly known as: KENALOG Apply topically.   Vitamin D3 1.25 MG (50000 UT) Caps Take 1 capsule by mouth once a  week.        Allergies: No Known Allergies  Past Medical History, Surgical history, Social history, and Family History were reviewed and updated.  Review of Systems: Review of Systems  Constitutional: Negative.   HENT: Negative.    Eyes: Negative.   Respiratory: Negative.    Cardiovascular: Negative.   Gastrointestinal: Negative.   Genitourinary: Negative.   Musculoskeletal: Negative.   Skin: Negative.   Neurological:  Positive for tingling.  Endo/Heme/Allergies: Negative.   Psychiatric/Behavioral: Negative.       Physical Exam:  weight is 483 lb (219.1 kg) (abnormal). His oral temperature is 98 F (36.7 C). His blood pressure is 111/71 and his pulse is 71. His respiration is 17 and oxygen saturation is 98%.   Wt Readings from Last 3 Encounters:  12/29/22 (!) 483 lb (219.1 kg)  06/10/22 (!) 350 lb (158.8 kg)  12/30/21 (!) 470 lb 1.3 oz (213.2 kg)    Physical Exam Vitals reviewed.  HENT:     Head: Normocephalic and atraumatic.  Eyes:     Pupils: Pupils are equal, round, and reactive to light.  Cardiovascular:     Rate and Rhythm: Normal rate and regular rhythm.  Heart sounds: Normal heart sounds.  Pulmonary:     Effort: Pulmonary effort is normal.     Breath sounds: Normal breath sounds.  Abdominal:     General: Bowel sounds are normal.     Palpations: Abdomen is soft.  Musculoskeletal:        General: No tenderness or deformity. Normal range of motion.     Cervical back: Normal range of motion.  Lymphadenopathy:     Cervical: No cervical adenopathy.  Skin:    General: Skin is warm and dry.     Findings: No erythema or rash.  Neurological:     Mental Status: He is alert and oriented to person, place, and time.  Psychiatric:        Behavior: Behavior normal.        Thought Content: Thought content normal.        Judgment: Judgment normal.      Lab Results  Component Value Date   WBC 8.0 12/29/2022   HGB 14.1 12/29/2022   HCT 42.5 12/29/2022    MCV 88.7 12/29/2022   PLT 253 12/29/2022   No results found for: "FERRITIN", "IRON", "TIBC", "UIBC", "IRONPCTSAT" Lab Results  Component Value Date   RBC 4.79 12/29/2022   Lab Results  Component Value Date   KPAFRELGTCHN 28.1 (H) 12/30/2021   LAMBDASER 18.7 12/30/2021   KAPLAMBRATIO 1.50 12/30/2021   Lab Results  Component Value Date   IGGSERUM 1,494 12/30/2021   IGA 502 (H) 12/30/2021   IGMSERUM 60 12/30/2021   Lab Results  Component Value Date   TOTALPROTELP 6.9 12/30/2021   ALBUMINELP 3.4 12/30/2021   A1GS 0.2 12/30/2021   A2GS 0.7 12/30/2021   BETS 1.3 12/30/2021   GAMS 1.3 12/30/2021   MSPIKE 0.5 (H) 12/30/2021   SPEI Comment 12/18/2020     Chemistry      Component Value Date/Time   NA 137 12/30/2021 1046   NA 139 03/07/2019 1100   K 3.7 12/30/2021 1046   CL 104 12/30/2021 1046   CO2 26 12/30/2021 1046   BUN 12 12/30/2021 1046   BUN 9 03/07/2019 1100   CREATININE 0.93 12/30/2021 1046      Component Value Date/Time   CALCIUM 9.2 12/30/2021 1046   ALKPHOS 67 12/30/2021 1046   AST 15 12/30/2021 1046   ALT 20 12/30/2021 1046   BILITOT 1.0 12/30/2021 1046      Impression and Plan: Mr. Schoeff is a pleasant 47yo African American gentleman with an IgG kappa MGUS, 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    He is now followed by [place] every 12 months. His last visit was on 12/30/2021. Today his CBC is normal. CMP shows mild hyperglycemia. MGUS labs pending. He is not having any unintentional weight loss, night sweats or new bone pains. He will follow up with PCP within 1 week and focus on a low carbohydrate diet.   RTC 1 year APP/MD, labs   Rushie Chestnut, [place] 8/1/202411:32 AM

## 2022-12-30 ENCOUNTER — Inpatient Hospital Stay: Payer: Commercial Managed Care - PPO

## 2022-12-30 ENCOUNTER — Ambulatory Visit: Payer: Commercial Managed Care - PPO | Admitting: Family

## 2023-02-21 ENCOUNTER — Ambulatory Visit: Payer: Commercial Managed Care - PPO | Admitting: Sports Medicine

## 2023-02-21 ENCOUNTER — Ambulatory Visit (INDEPENDENT_AMBULATORY_CARE_PROVIDER_SITE_OTHER): Payer: Commercial Managed Care - PPO | Admitting: Sports Medicine

## 2023-02-21 ENCOUNTER — Encounter: Payer: Self-pay | Admitting: Sports Medicine

## 2023-02-21 VITALS — BP 118/88 | Ht 76.0 in | Wt >= 6400 oz

## 2023-02-21 DIAGNOSIS — M654 Radial styloid tenosynovitis [de Quervain]: Secondary | ICD-10-CM | POA: Diagnosis not present

## 2023-02-21 NOTE — Progress Notes (Signed)
Subjective:    Patient ID: [name], male    DOB: 1976-03-27, 47 y.o.   MRN: [id number]  HPI chief complaint: Right wrist pain  Patient is a very pleasant 47 year old right-hand-dominant male that presents today with returning right wrist pain.  He has a history of de Quervain's tenosynovitis in the same wrist.  He has had 2 previous injections.  Each injection provided him with temporary pain relief.  His pain is most noticeable with thumb flexion.  He has been putting some topical medication on it which has been somewhat beneficial.  He has a brace but it does not sound like it is a thumb spica brace.  No recent trauma.    Review of Systems As above    Objective:   Physical Exam  Well-developed, well-nourished.  No acute distress  Right wrist: Good range of motion.  No effusion.  No obvious soft tissue swelling.  He is tender to palpation over the first extensor compartment at the radial styloid with a positive Finkelstein's.  Good pulses.      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  I recommend a referral to Dr. Janee Morn to discuss further treatment options.  In the meantime, we will fit him with a proper brace to immobilize the thumb.  He will wear it when active during the day.  This note was dictated using Dragon naturally speaking software and may contain errors in syntax, spelling, or content which have not been identified prior to signing this note.

## 2023-04-13 ENCOUNTER — Ambulatory Visit (INDEPENDENT_AMBULATORY_CARE_PROVIDER_SITE_OTHER): Payer: Commercial Managed Care - PPO | Admitting: Podiatry

## 2023-04-13 DIAGNOSIS — Z91199 Patient's noncompliance with other medical treatment and regimen due to unspecified reason: Secondary | ICD-10-CM

## 2023-04-16 NOTE — Progress Notes (Signed)
Patient was no-show for appointment today 

## 2023-05-29 ENCOUNTER — Ambulatory Visit: Payer: Commercial Managed Care - PPO | Admitting: Family Medicine

## 2023-05-29 NOTE — Progress Notes (Deleted)
CHIEF COMPLAINT: No chief complaint on file.  _____________________________________________________________ SUBJECTIVE  HPI  Pt is a 47 y.o. male here for evaluation of plantar fasciitis Location Primarily located Ongoing Radiating Numbness/tingling Exacerbated by Therapies tried     Has not been previously evaluated for this  Last seen 02/17/2023 in our clinic for de Quervain's tenosynovitis, recommended to Dr. Janee Morn  ------------------------------------------------------------------------------------------------------ Past Medical History:  Diagnosis Date   Eczema    Goals of care, counseling/discussion 12/18/2020   MGUS (monoclonal gammopathy of unknown significance) 12/18/2020   Pulmonary emboli [facility])     Past Surgical History:  Procedure Laterality Date   IR EPIDUROGRAPHY  12/11/2018   KNEE SURGERY     right   LAPAROSCOPIC GASTRIC BAND REMOVAL WITH LAPAROSCOPIC GASTRIC SLEEVE RESECTION     WRIST FRACTURE SURGERY        Outpatient Encounter Medications as of 05/29/2023  Medication Sig   acetaminophen (TYLENOL) 500 MG tablet Take 500 mg by mouth every 6 (six) hours as needed.   Cholecalciferol (VITAMIN D3) 1.25 MG (50000 UT) CAPS Take 1 capsule by mouth once a week.   fluticasone (FLONASE) 50 MCG/ACT nasal spray Place 2 sprays into both nostrils daily.   Multiple Vitamins-Minerals (BARIATRIC MULTIVITAMINS/IRON PO) Take 1 tablet by mouth daily.   nitroGLYCERIN (NITRODUR - DOSED IN MG/24 HR) 0.2 mg/hr patch Cut and apply 1/4 patch to most painful area q24h. (Patient not taking: Reported on 12/29/2022)   sildenafil (VIAGRA) 50 MG tablet Take 50 mg by mouth as needed.   triamcinolone cream (KENALOG) 0.1 % Apply topically.   No facility-administered encounter medications on file as of 05/29/2023.     ------------------------------------------------------------------------------------------------------  _____________________________________________________________ OBJECTIVE  PHYSICAL EXAM  There were no vitals filed for this visit. There is no height or weight on file to calculate BMI.   reviewed  General: A+Ox3, no acute distress, well-nourished, appropriate affect CV: pulses 2+ regular, nondiaphoretic, no peripheral edema, cap refill <2sec Lungs: no audible wheezing, non-labored breathing, bilateral chest rise/fall, nontachypneic Skin: warm, well-perfused, non-icteric, no susp lesions or rashes Neuro: CN II-XII intact bilaterally. Sensation intact, muscle tone wnl, no atrophy Psych: no signs of depression or anxiety MSK: ***      _____________________________________________________________ ASSESSMENT/PLAN There are no diagnoses linked to this encounter.  Summary: ***  Differential Diagnosis:  Treatment:   No follow-ups on file.  Electronically signed by: Burna Forts, MD 05/29/2023 7:56 AM

## 2023-06-01 ENCOUNTER — Encounter: Payer: Self-pay | Admitting: Family Medicine

## 2023-06-01 ENCOUNTER — Ambulatory Visit (INDEPENDENT_AMBULATORY_CARE_PROVIDER_SITE_OTHER): Payer: Commercial Managed Care - PPO | Admitting: Family Medicine

## 2023-06-01 VITALS — BP 130/80 | Ht 76.0 in | Wt >= 6400 oz

## 2023-06-01 DIAGNOSIS — M722 Plantar fascial fibromatosis: Secondary | ICD-10-CM | POA: Diagnosis not present

## 2023-06-01 NOTE — Progress Notes (Signed)
 CHIEF COMPLAINT: No chief complaint on file.  _____________________________________________________________ SUBJECTIVE  HPI  Pt is a 48 y.o. male here for evaluation of R foot pain  Has been ongoing for a year, no inciting events. R sided, no numbness or tingling, non-radiating. Fairly unchanged in quality/location. Feels worse when he wakes up in the morning. Has not been previously evaluated for this  Last seen 02/17/2023 in our clinic for de Quervain's tenosynovitis, recommended to Dr. Sebastian  Of note, got orthotics for knee pain, states orthotics were too big and has not been using. These were fitted 08/2021 ------------------------------------------------------------------------------------------------------ Past Medical History:  Diagnosis Date   Eczema    Goals of care, counseling/discussion 12/18/2020   MGUS (monoclonal gammopathy of unknown significance) 12/18/2020   Pulmonary emboli [facility])     Past Surgical History:  Procedure Laterality Date   IR EPIDUROGRAPHY  12/11/2018   KNEE SURGERY     right   LAPAROSCOPIC GASTRIC BAND REMOVAL WITH LAPAROSCOPIC GASTRIC SLEEVE RESECTION     WRIST FRACTURE SURGERY        Outpatient Encounter Medications as of 06/01/2023  Medication Sig   acetaminophen  (TYLENOL ) 500 MG tablet Take 500 mg by mouth every 6 (six) hours as needed.   Cholecalciferol (VITAMIN D3) 1.25 MG (50000 UT) CAPS Take 1 capsule by mouth once a week.   fluticasone (FLONASE) 50 MCG/ACT nasal spray Place 2 sprays into both nostrils daily.   Multiple Vitamins-Minerals (BARIATRIC MULTIVITAMINS/IRON PO) Take 1 tablet by mouth daily.   nitroGLYCERIN  (NITRODUR - DOSED IN MG/24 HR) 0.2 mg/hr patch Cut and apply 1/4 patch to most painful area q24h. (Patient not taking: Reported on 12/29/2022)   sildenafil (VIAGRA) 50 MG tablet Take 50 mg by mouth as needed.   triamcinolone  cream (KENALOG ) 0.1 % Apply topically.   No facility-administered encounter medications on file as of  06/01/2023.    ------------------------------------------------------------------------------------------------------  _____________________________________________________________ OBJECTIVE  PHYSICAL EXAM  Today's Vitals   06/01/23 1138  BP: 130/80  Weight: (!) 478 lb (216.8 kg)  Height: 6' 4 (1.93 m)   Body mass index is 58.18 kg/m.   reviewed  General: A+Ox3, no acute distress, well-nourished, appropriate affect CV: pulses 2+ regular, nondiaphoretic, no peripheral edema, cap refill <2sec Lungs: no audible wheezing, non-labored breathing, bilateral chest rise/fall, nontachypneic Skin: warm, well-perfused, non-icteric, no susp lesions or rashes Neuro: Sensation intact, muscle tone wnl, no atrophy Psych: no signs of depression or anxiety MSK:  Foot/ ankle Clean, dry, intact skin. No ecchymosis, no erythema, no increased warmth. no tenderness over ATFL. no tenderness over the base of the fifth metatarsal, no tenderness over proximal fibula. no tenderness over medial/lateral malleolus. no tenderness over navicular, mid-foot. Ankle strength 5/5. No calf pain or swelling. R ankle with full range of motion without pain, weakness, or instability. Nontender calcaneus, TTP over proximal plantar fascia.  NEURO: sensation intact to light touch, no focal defecits VASCULAR: pulses +2/4 dorsalis pedis & posterior tibialis  _____________________________________________________________ ASSESSMENT/PLAN Diagnoses and all orders for this visit:  Plantar fasciitis, right  History and exam c/w R plantar fasciitis. Discussed options for management - HEP exercises provided - may take tylenol , ibuprofen , discussed use of ice vs heat, other conservative therapies - would benefit from shoe fitting, he is considering going to [employer] for evaluation - referral to our [place] office to facilitate no-charge orthotics re-fitting, as he states he never really ended up using the orthotics as they felt  too big.  If pain persists/worsens consider XR/POCUS for further  evaluation  Anticipate f/u in 3-4 weeks, sooner as needed. All questions answered. Return precautions discussed. Patient verbalized understanding and is in agreement with plan  Electronically signed by: Benedict LELON Bumps, MD 06/01/2023 11:38 AM

## 2023-06-29 ENCOUNTER — Ambulatory Visit: Payer: Commercial Managed Care - PPO | Admitting: Family Medicine

## 2023-06-29 NOTE — Progress Notes (Deleted)
 CHIEF COMPLAINT: No chief complaint on file.  _____________________________________________________________ SUBJECTIVE  HPI  Pt is a 48 y.o. male here for followup  Last seen 06/01/23 Has been ongoing for a year, no inciting events. R sided, no numbness or tingling, non-radiating. Fairly unchanged in quality/location. Feels worse when he wakes up in the morning...got orthotics for knee pain, states orthotics were too big and has not been using. 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 - may take tylenol, ibuprofen, discussed use of ice vs heat, other conservative therapies - would benefit from shoe fitting, he is considering going to [employer] for evaluation - referral to our [place] office to facilitate no-charge orthotics re-fitting If pain persists/worsens consider XR/POCUS for further evaluation    ------------------------------------------------------------------------------------------------------ Past Medical History:  Diagnosis Date   Eczema    Goals of care, counseling/discussion 12/18/2020   MGUS (monoclonal gammopathy of unknown significance) 12/18/2020   Pulmonary emboli [facility])     Past Surgical History:  Procedure Laterality Date   IR EPIDUROGRAPHY  12/11/2018   KNEE SURGERY     right   LAPAROSCOPIC GASTRIC BAND REMOVAL WITH LAPAROSCOPIC GASTRIC SLEEVE RESECTION     WRIST FRACTURE SURGERY        Outpatient Encounter Medications as of 06/29/2023  Medication Sig   acetaminophen (TYLENOL) 500 MG tablet Take 500 mg by mouth every 6 (six) hours as needed.   Cholecalciferol (VITAMIN D3) 1.25 MG (50000 UT) CAPS Take 1 capsule by mouth once a week.   fluticasone (FLONASE) 50 MCG/ACT nasal spray Place 2 sprays into both nostrils daily.   Multiple  Vitamins-Minerals (BARIATRIC MULTIVITAMINS/IRON PO) Take 1 tablet by mouth daily.   nitroGLYCERIN (NITRODUR - DOSED IN MG/24 HR) 0.2 mg/hr patch Cut and apply 1/4 patch to most painful area q24h. (Patient not taking: Reported on 12/29/2022)   sildenafil (VIAGRA) 50 MG tablet Take 50 mg by mouth as needed.   triamcinolone cream (KENALOG) 0.1 % Apply topically.   No facility-administered encounter medications on file as of 06/29/2023.    ------------------------------------------------------------------------------------------------------  _____________________________________________________________ OBJECTIVE  PHYSICAL EXAM  There were no vitals filed for this visit. There is no height or weight on file to calculate BMI.   reviewed  General: A+Ox3, no acute distress, well-nourished, appropriate affect CV: pulses 2+ regular, nondiaphoretic, no peripheral edema, cap refill <2sec Lungs: no audible wheezing, non-labored breathing, bilateral chest rise/fall, nontachypneic Skin: warm, well-perfused, non-icteric, no susp lesions or rashes Neuro: *** Sensation intact, muscle tone wnl, no atrophy Psych: no signs of depression or anxiety MSK: ***      _____________________________________________________________ ASSESSMENT/PLAN There are no diagnoses linked to this encounter.  XR foot, POCUS    Electronically signed by: Burna Forts, MD 06/29/2023 7:47 AM

## 2023-07-06 ENCOUNTER — Ambulatory Visit: Payer: Commercial Managed Care - PPO | Admitting: Family Medicine

## 2023-07-06 NOTE — Progress Notes (Deleted)
.  jqhpiCHIEF COMPLAINT: No chief complaint on file.  _____________________________________________________________ SUBJECTIVE  HPI  Pt is a 48 y.o. male here for followup  Last seen 06/01/23 Has been ongoing for a year, no inciting events. R sided, no numbness or tingling, non-radiating. Fairly unchanged in quality/location. Feels worse when he wakes up in the morning...got orthotics for knee pain, states orthotics were too big and has not been using. 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 - may take tylenol , ibuprofen , discussed use of ice vs heat, other conservative therapies - would benefit from shoe fitting, he is considering going to [employer] for evaluation - referral to our [place] office to facilitate no-charge orthotics re-fitting If pain persists/worsens consider XR/POCUS for further evaluation    ------------------------------------------------------------------------------------------------------ Past Medical History:  Diagnosis Date   Eczema    Goals of care, counseling/discussion 12/18/2020   MGUS (monoclonal gammopathy of unknown significance) 12/18/2020   Pulmonary emboli [facility])     Past Surgical History:  Procedure Laterality Date   IR EPIDUROGRAPHY  12/11/2018   KNEE SURGERY     right   LAPAROSCOPIC GASTRIC BAND REMOVAL WITH LAPAROSCOPIC GASTRIC SLEEVE RESECTION     WRIST FRACTURE SURGERY        Outpatient Encounter Medications as of 07/06/2023  Medication Sig   acetaminophen  (TYLENOL ) 500 MG tablet Take 500 mg by mouth every 6 (six) hours as needed.   Cholecalciferol (VITAMIN D3) 1.25 MG (50000 UT) CAPS Take 1 capsule by mouth once a week.   fluticasone (FLONASE) 50 MCG/ACT nasal spray Place 2 sprays into both nostrils daily.   Multiple  Vitamins-Minerals (BARIATRIC MULTIVITAMINS/IRON PO) Take 1 tablet by mouth daily.   nitroGLYCERIN  (NITRODUR - DOSED IN MG/24 HR) 0.2 mg/hr patch Cut and apply 1/4 patch to most painful area q24h. (Patient not taking: Reported on 12/29/2022)   sildenafil (VIAGRA) 50 MG tablet Take 50 mg by mouth as needed.   triamcinolone  cream (KENALOG ) 0.1 % Apply topically.   No facility-administered encounter medications on file as of 07/06/2023.    ------------------------------------------------------------------------------------------------------  _____________________________________________________________ OBJECTIVE  PHYSICAL EXAM  There were no vitals filed for this visit. There is no height or weight on file to calculate BMI.   reviewed  General: A+Ox3, no acute distress, well-nourished, appropriate affect CV: pulses 2+ regular, nondiaphoretic, no peripheral edema, cap refill <2sec Lungs: no audible wheezing, non-labored breathing, bilateral chest rise/fall, nontachypneic Skin: warm, well-perfused, non-icteric, no susp lesions or rashes Neuro: *** Sensation intact, muscle tone wnl, no atrophy Psych: no signs of depression or anxiety MSK: ***      _____________________________________________________________ ASSESSMENT/PLAN There are no diagnoses linked to this encounter.  XR foot, POCUS    Electronically signed by: Malachai Schalk W Melville Engen, MD 07/06/2023 1:05 PM

## 2023-07-13 ENCOUNTER — Ambulatory Visit: Payer: Commercial Managed Care - PPO | Admitting: Family Medicine

## 2023-07-13 NOTE — Progress Notes (Deleted)
 CHIEF COMPLAINT: No chief complaint on file.  _____________________________________________________________ SUBJECTIVE  HPI  Pt is a 48 y.o. male here for followup  Last seen 06/01/23 Has been ongoing for a year, no inciting events. R sided, no numbness or tingling, non-radiating. Fairly unchanged in quality/location. Feels worse when he wakes up in the morning...got orthotics for knee pain, states orthotics were too big and has not been using. 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***     ------------------------------------------------------------------------------------------------------ Past Medical History:  Diagnosis Date   Eczema    Goals of care, counseling/discussion 12/18/2020   MGUS (monoclonal gammopathy of unknown significance) 12/18/2020   Pulmonary emboli (HCC)     Past Surgical History:  Procedure Laterality Date   IR EPIDUROGRAPHY  12/11/2018   KNEE SURGERY     right   LAPAROSCOPIC GASTRIC BAND REMOVAL WITH LAPAROSCOPIC GASTRIC SLEEVE RESECTION     WRIST FRACTURE SURGERY        Outpatient Encounter Medications as of 07/13/2023  Medication Sig   acetaminophen (TYLENOL) 500 MG tablet Take 500 mg by mouth every 6 (six) hours as needed.   Cholecalciferol (VITAMIN D3) 1.25 MG (50000 UT) CAPS Take 1 capsule by mouth once a week.   fluticasone (FLONASE) 50 MCG/ACT nasal spray Place 2 sprays into both nostrils  daily.   Multiple Vitamins-Minerals (BARIATRIC MULTIVITAMINS/IRON PO) Take 1 tablet by mouth daily.   nitroGLYCERIN (NITRODUR - DOSED IN MG/24 HR) 0.2 mg/hr patch Cut and apply 1/4 patch to most painful area q24h. (Patient not taking: Reported on 12/29/2022)   sildenafil (VIAGRA) 50 MG tablet Take 50 mg by mouth as needed.   triamcinolone cream (KENALOG) 0.1 % Apply topically.   No facility-administered encounter medications on file as of 07/13/2023.    ------------------------------------------------------------------------------------------------------  _____________________________________________________________ OBJECTIVE  PHYSICAL EXAM  There were no vitals filed for this visit. There is no height or weight on file to calculate BMI.   reviewed  General: A+Ox3, no acute distress, well-nourished, appropriate affect CV: pulses 2+ regular, nondiaphoretic, no peripheral edema, cap refill <2sec Lungs: no audible wheezing, non-labored breathing, bilateral chest rise/fall, nontachypneic Skin: warm, well-perfused, non-icteric, no susp lesions or rashes Neuro: *** Sensation intact, muscle tone wnl, no atrophy Psych: no signs of depression or anxiety MSK: ***      _____________________________________________________________ ASSESSMENT/PLAN There are no diagnoses linked to this encounter.  XR foot, POCUS    Electronically signed by: Burna Forts, MD 07/13/2023 1:03 PM

## 2023-12-28 ENCOUNTER — Inpatient Hospital Stay: Payer: Commercial Managed Care - PPO | Attending: Family

## 2023-12-28 ENCOUNTER — Inpatient Hospital Stay (HOSPITAL_BASED_OUTPATIENT_CLINIC_OR_DEPARTMENT_OTHER): Payer: Commercial Managed Care - PPO | Admitting: Medical Oncology

## 2023-12-28 VITALS — BP 137/79 | HR 65 | Temp 97.9°F | Resp 18 | Ht 76.0 in | Wt >= 6400 oz

## 2023-12-28 DIAGNOSIS — D472 Monoclonal gammopathy: Secondary | ICD-10-CM | POA: Insufficient documentation

## 2023-12-28 LAB — CMP (CANCER CENTER ONLY)
ALT: 17 U/L (ref 0–44)
AST: 18 U/L (ref 15–41)
Albumin: 3.9 g/dL (ref 3.5–5.0)
Alkaline Phosphatase: 86 U/L (ref 38–126)
Anion gap: 13 (ref 5–15)
BUN: 13 mg/dL (ref 6–20)
CO2: 21 mmol/L — ABNORMAL LOW (ref 22–32)
Calcium: 9.2 mg/dL (ref 8.9–10.3)
Chloride: 104 mmol/L (ref 98–111)
Creatinine: 0.96 mg/dL (ref 0.61–1.24)
GFR, Estimated: >60 mL/min (ref >60)
Glucose, Bld: 106 mg/dL — ABNORMAL HIGH (ref 70–99)
Potassium: 4 mmol/L (ref 3.5–5.1)
Sodium: 138 mmol/L (ref 135–145)
Total Bilirubin: 0.8 mg/dL (ref 0.0–1.2)
Total Protein: 7.5 g/dL (ref 6.5–8.1)

## 2023-12-28 LAB — CBC WITH DIFFERENTIAL (CANCER CENTER ONLY)
Abs Immature Granulocytes: 0.04 K/uL (ref 0.00–0.07)
Basophils Absolute: 0.1 K/uL (ref 0.0–0.1)
Basophils Relative: 1 %
Eosinophils Absolute: 0.3 K/uL (ref 0.0–0.5)
Eosinophils Relative: 4 %
HCT: 44.3 % (ref 39.0–52.0)
Hemoglobin: 14.9 g/dL (ref 13.0–17.0)
Immature Granulocytes: 1 %
Lymphocytes Relative: 29 %
Lymphs Abs: 2.3 K/uL (ref 0.7–4.0)
MCH: 29.3 pg (ref 26.0–34.0)
MCHC: 33.6 g/dL (ref 30.0–36.0)
MCV: 87.2 fL (ref 80.0–100.0)
Monocytes Absolute: 0.6 K/uL (ref 0.1–1.0)
Monocytes Relative: 8 %
Neutro Abs: 4.7 K/uL (ref 1.7–7.7)
Neutrophils Relative %: 57 %
Platelet Count: 280 K/uL (ref 150–400)
RBC: 5.08 MIL/uL (ref 4.22–5.81)
RDW: 13.2 % (ref 11.5–15.5)
WBC Count: 8 K/uL (ref 4.0–10.5)
nRBC: 0 % (ref 0.0–0.2)

## 2023-12-28 LAB — LACTATE DEHYDROGENASE: LDH: 177 U/L (ref 98–192)

## 2023-12-28 NOTE — Progress Notes (Signed)
 Hematology and Oncology Follow Up Visit  [name] [id number] [birth year] 48 y.o. 12/28/2023   Principle Diagnosis:  IgG kappa MGUS   Current Therapy:        Observation   Interim History:  [name] is here today for follow-up. We last saw him in August 2024  Today he states that he has been well.   At his last visit in August, 2024, his monoclonal spike is still stable at 0.5 g/dL.  His IgG level was 1442 mg/dL.  The IgA level was 449 mg/dL.  The Kappa light chain was 2.5 mg/dL.  He has had no problem with infections.  He has had no problems with cough or shortness of breath.  He has had no nausea or vomiting.  There is been no change in bowel or bladder habits.  He has had no rashes.  Overall, I would have said his performance status is probably ECOG 0.    Wt Readings from Last 3 Encounters:  12/28/23 (!) 491 lb (222.7 kg)  06/01/23 (!) 478 lb (216.8 kg)  02/21/23 (!) 478 lb (216.8 kg)    Medications:  Allergies as of 12/28/2023   No Known Allergies      Medication List        Accurate as of December 28, 2023 10:20 AM. If you have any questions, ask your nurse or doctor.          acetaminophen  500 MG tablet Commonly known as: TYLENOL  Take 500 mg by mouth every 6 (six) hours as needed.   BARIATRIC MULTIVITAMINS/IRON PO Take 1 tablet by mouth daily.   fluticasone 50 MCG/ACT nasal spray Commonly known as: FLONASE Place 2 sprays into both nostrils daily.   nitroGLYCERIN  0.2 mg/hr patch Commonly known as: NITRODUR - Dosed in mg/24 hr Cut and apply 1/4 patch to most painful area q24h.   sildenafil 50 MG tablet Commonly known as: VIAGRA Take 50 mg by mouth as needed.   triamcinolone  cream 0.1 % Commonly known as: KENALOG  Apply topically.   Vitamin D3 1.25 MG (50000 UT) Caps Take 1 capsule by mouth once a week.        Allergies: No Known Allergies  Past Medical History, Surgical history, Social history, and Family History were reviewed and  updated.  Review of Systems: Review of Systems  Constitutional: Negative.   HENT: Negative.    Eyes: Negative.   Respiratory: Negative.    Cardiovascular: Negative.   Gastrointestinal: Negative.   Genitourinary: Negative.   Musculoskeletal: Negative.   Skin: Negative.   Neurological:  Negative for tingling.  Endo/Heme/Allergies: Negative.   Psychiatric/Behavioral: Negative.     Physical Exam:  height is 6' 4 (1.93 m) and weight is 491 lb (222.7 kg) (abnormal). His oral temperature is 97.9 F (36.6 C). His blood pressure is 137/79 and his pulse is 65. His respiration is 18 and oxygen saturation is 99%.   Wt Readings from Last 3 Encounters:  12/28/23 (!) 491 lb (222.7 kg)  06/01/23 (!) 478 lb (216.8 kg)  02/21/23 (!) 478 lb (216.8 kg)    Physical Exam Vitals reviewed.  HENT:     Head: Normocephalic and atraumatic.  Eyes:     Pupils: Pupils are equal, round, and reactive to light.  Cardiovascular:     Rate and Rhythm: Normal rate and regular rhythm.     Heart sounds: Normal heart sounds.  Pulmonary:     Effort: Pulmonary effort is normal.     Breath sounds: Normal breath  sounds.  Abdominal:     General: Bowel sounds are normal.     Palpations: Abdomen is soft.  Musculoskeletal:        General: No tenderness or deformity. Normal range of motion.     Cervical back: Normal range of motion.  Lymphadenopathy:     Cervical: No cervical adenopathy.  Skin:    General: Skin is warm and dry.     Findings: No erythema or rash.  Neurological:     Mental Status: He is alert and oriented to person, place, and time.  Psychiatric:        Behavior: Behavior normal.        Thought Content: Thought content normal.        Judgment: Judgment normal.      Lab Results  Component Value Date   WBC 8.0 12/28/2023   HGB 14.9 12/28/2023   HCT 44.3 12/28/2023   MCV 87.2 12/28/2023   PLT 280 12/28/2023   No results found for: FERRITIN, IRON, TIBC, UIBC, IRONPCTSAT Lab  Results  Component Value Date   RBC 5.08 12/28/2023   Lab Results  Component Value Date   KPAFRELGTCHN 25.3 (H) 12/29/2022   LAMBDASER 18.1 12/29/2022   KAPLAMBRATIO 1.40 12/29/2022   Lab Results  Component Value Date   IGGSERUM 1,442 12/29/2022   IGGSERUM 1,467 12/29/2022   IGA 449 (H) 12/29/2022   IGA 440 (H) 12/29/2022   IGMSERUM 46 12/29/2022   IGMSERUM 51 12/29/2022   Lab Results  Component Value Date   TOTALPROTELP 6.7 12/29/2022   ALBUMINELP 3.4 12/29/2022   A1GS 0.2 12/29/2022   A2GS 0.6 12/29/2022   BETS 1.3 12/29/2022   GAMS 1.2 12/29/2022   MSPIKE 0.5 (H) 12/29/2022   SPEI Comment 12/18/2020     Chemistry      Component Value Date/Time   NA 138 12/29/2022 1103   NA 139 03/07/2019 1100   K 3.7 12/29/2022 1103   CL 106 12/29/2022 1103   CO2 23 12/29/2022 1103   BUN 12 12/29/2022 1103   BUN 9 03/07/2019 1100   CREATININE 0.84 12/29/2022 1103      Component Value Date/Time   CALCIUM 8.7 (L) 12/29/2022 1103   ALKPHOS 70 12/29/2022 1103   AST 14 (L) 12/29/2022 1103   ALT 17 12/29/2022 1103   BILITOT 0.8 12/29/2022 1103     Encounter Diagnosis  Name Primary?   MGUS (monoclonal gammopathy of unknown significance) Yes    Impression and Plan: [name] is a pleasant 48 yo African American gentleman with an IgG kappa MGUS, 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  Currently on observation.   He is now followed by us  every 12 months.   CBC is normal today CMP pending at this time.  MGUS labs pending.  RTC 1 year APP/MD, labs (SPEP, light chains, LDH, CBC w/, CMP)   Lauraine CHRISTELLA Dais, PA-C 7/31/202510:20 AM

## 2023-12-29 LAB — IGG, IGA, IGM
IgA: 488 mg/dL — ABNORMAL HIGH (ref 90–386)
IgG (Immunoglobin G), Serum: 1519 mg/dL (ref 603–1613)
IgM (Immunoglobulin M), Srm: 50 mg/dL (ref 20–172)

## 2023-12-29 LAB — KAPPA/LAMBDA LIGHT CHAINS
Kappa free light chain: 27.8 mg/L — ABNORMAL HIGH (ref 3.3–19.4)
Kappa, lambda light chain ratio: 1.45 (ref 0.26–1.65)
Lambda free light chains: 19.2 mg/L (ref 5.7–26.3)

## 2024-01-01 ENCOUNTER — Ambulatory Visit: Payer: Self-pay | Admitting: Medical Oncology

## 2024-01-02 LAB — MULTIPLE MYELOMA PANEL, SERUM
Albumin SerPl Elph-Mcnc: 3.7 g/dL (ref 2.9–4.4)
Albumin/Glob SerPl: 1.1 (ref 0.7–1.7)
Alpha 1: 0.2 g/dL (ref 0.0–0.4)
Alpha2 Glob SerPl Elph-Mcnc: 0.6 g/dL (ref 0.4–1.0)
B-Globulin SerPl Elph-Mcnc: 1.4 g/dL — ABNORMAL HIGH (ref 0.7–1.3)
Gamma Glob SerPl Elph-Mcnc: 1.2 g/dL (ref 0.4–1.8)
Globulin, Total: 3.5 g/dL (ref 2.2–3.9)
IgA: 488 mg/dL — ABNORMAL HIGH (ref 90–386)
IgG (Immunoglobin G), Serum: 1528 mg/dL (ref 603–1613)
IgM (Immunoglobulin M), Srm: 52 mg/dL (ref 20–172)
M Protein SerPl Elph-Mcnc: 0.5 g/dL — ABNORMAL HIGH
Total Protein ELP: 7.2 g/dL (ref 6.0–8.5)

## 2024-12-27 ENCOUNTER — Inpatient Hospital Stay

## 2024-12-27 ENCOUNTER — Ambulatory Visit: Admitting: Hematology & Oncology
